# Patient Record
Sex: Male | Born: 1985 | Race: White | Hispanic: No | Marital: Single | Smoking: Former smoker
Health system: Southern US, Community
[De-identification: ages and names within clinical notes are randomized; demographics above are authoritative.]

## PROBLEM LIST (undated history)

## (undated) DIAGNOSIS — F419 Anxiety disorder, unspecified: Secondary | ICD-10-CM

## (undated) DIAGNOSIS — T7840XA Allergy, unspecified, initial encounter: Secondary | ICD-10-CM

## (undated) HISTORY — DX: Allergy, unspecified, initial encounter: T78.40XA

## (undated) HISTORY — DX: Anxiety disorder, unspecified: F41.9

---
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
## 2004-03-01 HISTORY — PX: FRACTURE SURGERY: SHX138

## 2004-06-20 IMAGING — CT CT HEAD W/O CM
1 series · 16 of 30 positions shown, 20 images · non-contrast
Comparison: none

CLINICAL DATA: Traumatic intraparenchymal and extraaxial hemorrhage.  Follow-up.
 CT HEAD WITHOUT CONTRAST
TECHNIQUE: Multiple contiguous axial images from the skull base to the vertex performed

[Series 2: trauma head · axial · 0.47mm/px · z∈[+166,+291]mm · 16 of 52 slices shown, 20 images]
[im 2/52  brain]
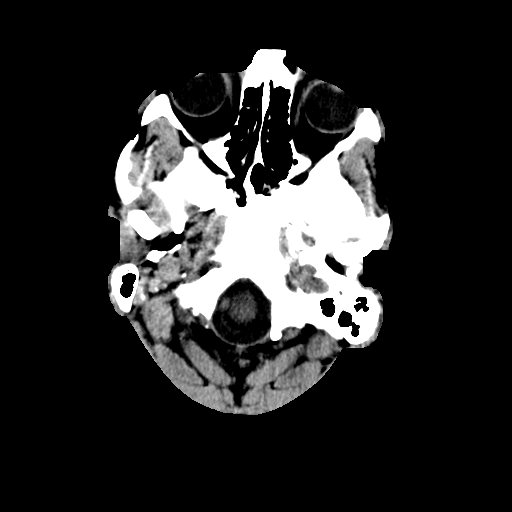
[im 2/52  bone]
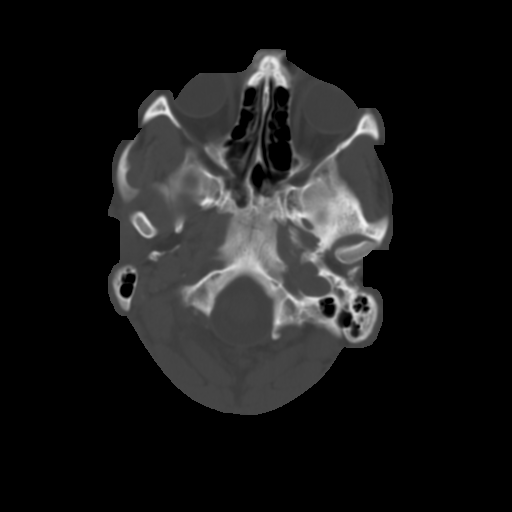
[im 6/52  brain]
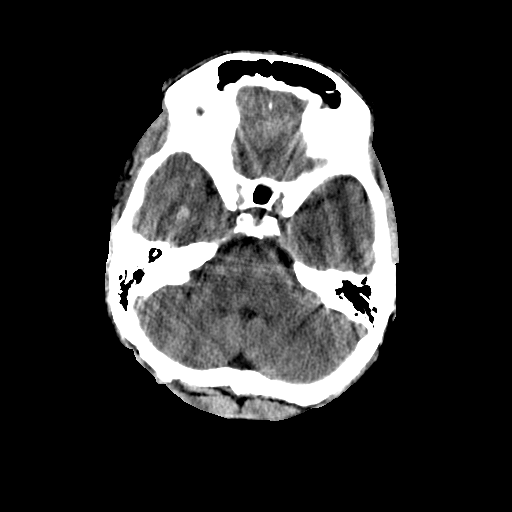
[im 9/52  brain]
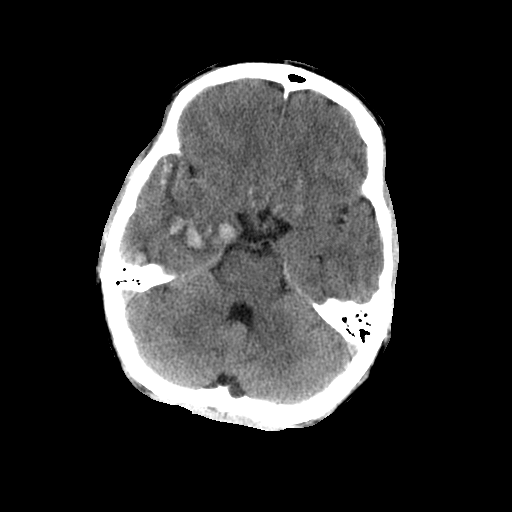
[im 13/52  brain]
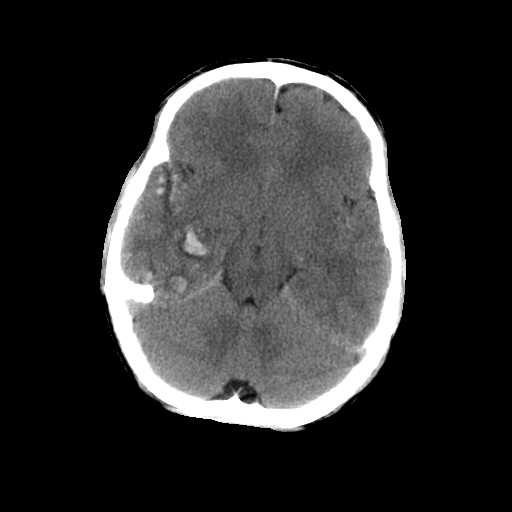
[im 15/52  brain]
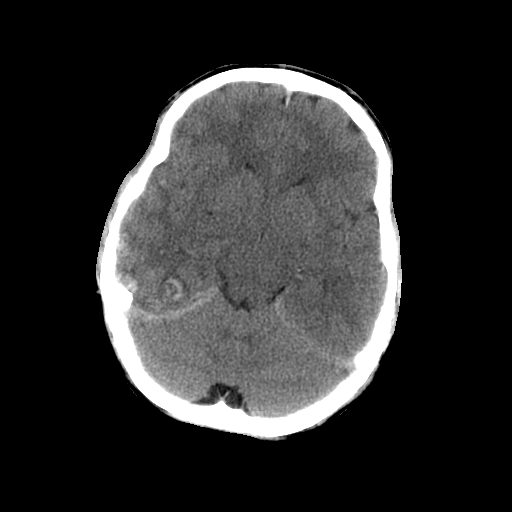
[im 15/52  bone]
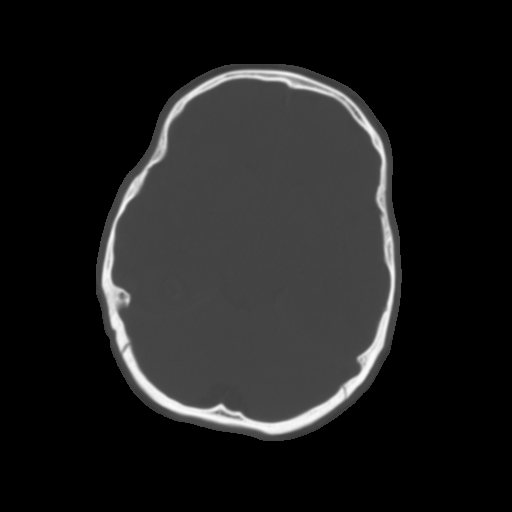
[im 18/52  brain]
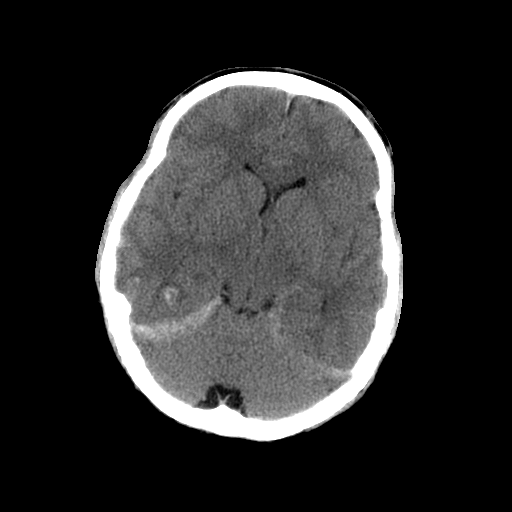
[im 22/52  brain]
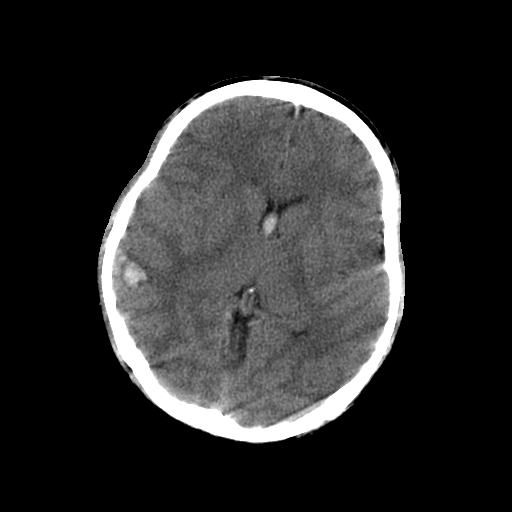
[im 25/52  brain]
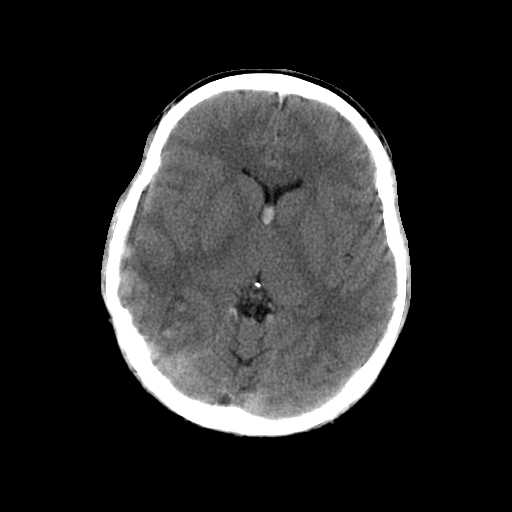
[im 27/52  brain]
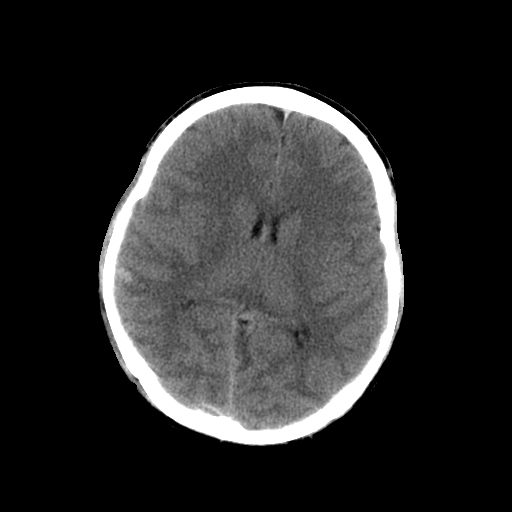
[im 27/52  bone]
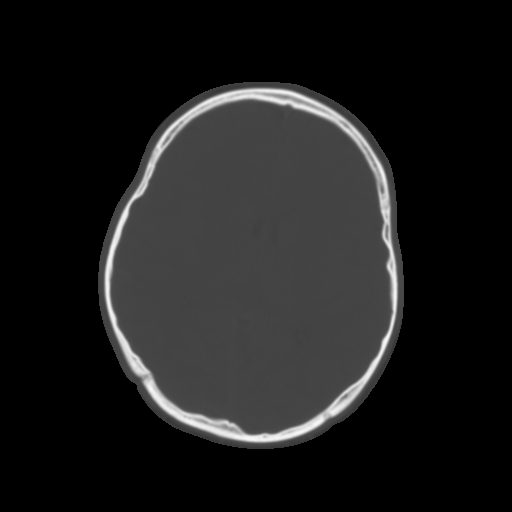
[im 30/52  brain]
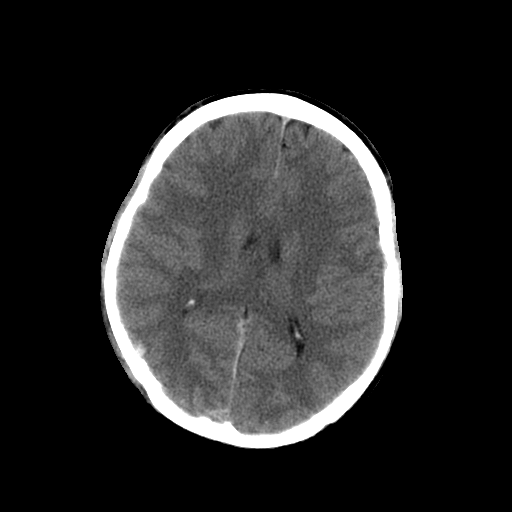
[im 34/52  brain]
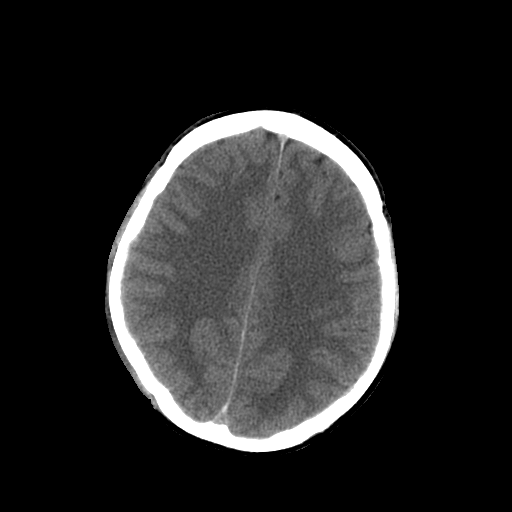
[im 37/52  brain]
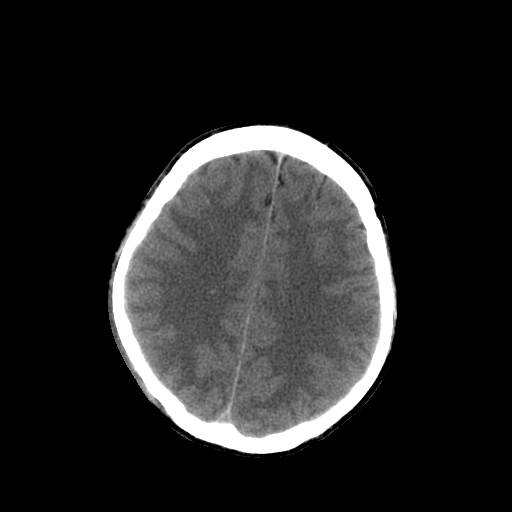
[im 39/52  brain]
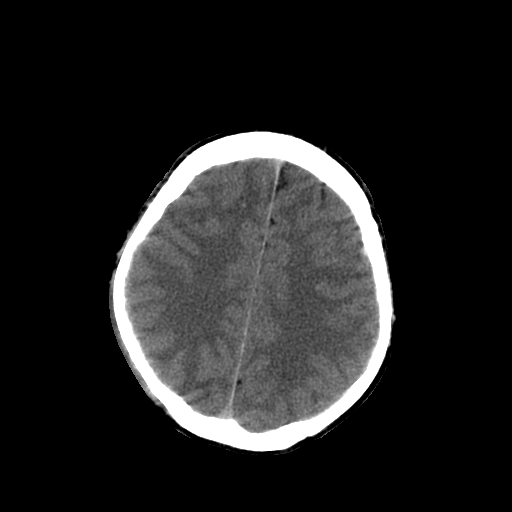
[im 39/52  bone]
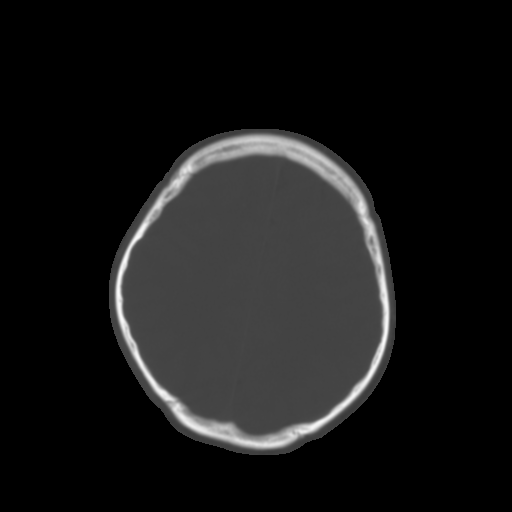
[im 43/52  brain]
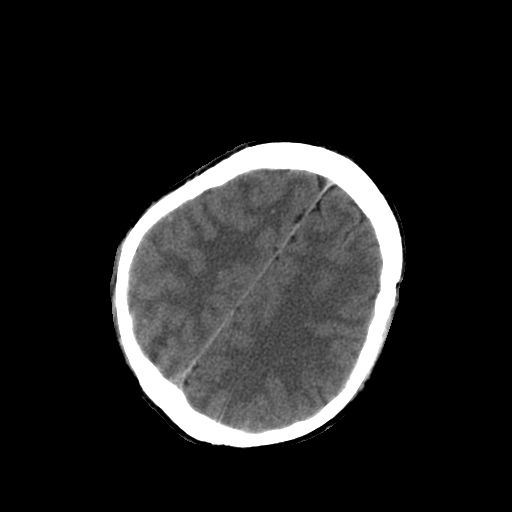
[im 46/52  brain]
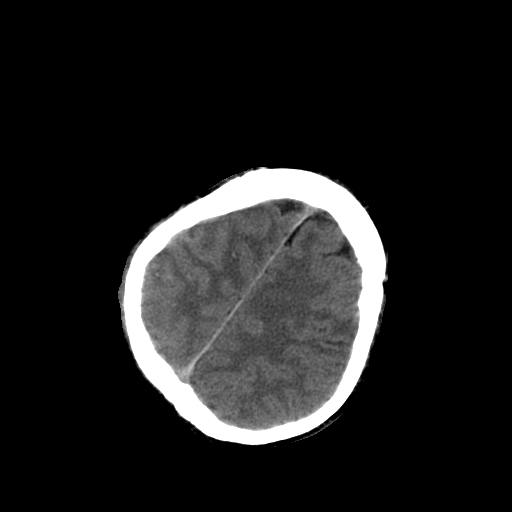
[im 50/52  brain]
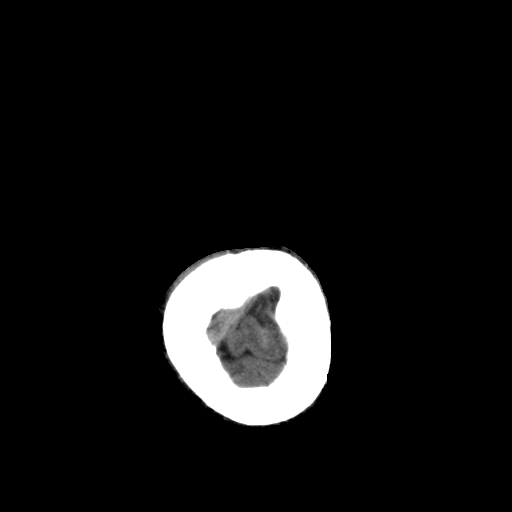

[16 of 30 positions shown; findings below may reference images not displayed]

FINDINGS: Comparison to 01/19/03.
 Again noted are multiple foci of intraparenchymal hemorrhage within bifrontal, right temporal, and bilateral convexity areas, greatest in the right temporal region.  These foci are essentially unchanged except for in the right temporal lobe, which may be slightly increased in size since the last study.  Blood layering on the right tentorium and blood along the anterior cavum septum pellucidum is unchanged.  No other changes identified.
 IMPRESSION 
 Multiple scattered areas of intracranial hemorrhage as described, essentially unchanged except for minimal increase in the right temporal lobe.

## 2012-04-07 ENCOUNTER — Ambulatory Visit (INDEPENDENT_AMBULATORY_CARE_PROVIDER_SITE_OTHER): Payer: BC Managed Care – PPO | Admitting: Physician Assistant

## 2012-04-07 VITALS — BP 135/84 | HR 70 | Temp 98.8°F | Resp 17 | Ht 72.0 in | Wt 196.0 lb

## 2012-04-07 DIAGNOSIS — F419 Anxiety disorder, unspecified: Secondary | ICD-10-CM | POA: Insufficient documentation

## 2012-04-07 DIAGNOSIS — Z23 Encounter for immunization: Secondary | ICD-10-CM

## 2012-04-07 DIAGNOSIS — F988 Other specified behavioral and emotional disorders with onset usually occurring in childhood and adolescence: Secondary | ICD-10-CM | POA: Insufficient documentation

## 2012-04-07 NOTE — Patient Instructions (Addendum)
Immunization Schedule, Adult  Influenza vaccine.  Adults should be given 1 dose every year.  Tetanus, diphtheria, and pertussis (Td, Tdap) vaccine.  Adults who have not previously been given Tdap or who do not know their vaccine status should be given 1 dose of Tdap.  Adults should have a Td booster every 10 years.  Doses should be given if needed to catch up on missed doses in the past.  Pregnant women should be given 1 dose of Tdap vaccine during each pregnancy.  Varicella vaccine.  All adults without evidence of immunity to varicella should receive 2 doses or a second dose if they have received only 1 dose.  Pregnant women who do not have evidence of immunity should be given the first dose after their pregnancy.  Human papillomavirus (HPV) vaccine.  Women aged 13 through 26 years who have not been given the vaccine previously should be given the 3 dose series. The second dose should be given 1 to 2 months after the first dose. The third dose should be given at least 24 weeks after the first dose.  The vaccine is not recommended for use in pregnant women. However, pregnancy testing is not needed before being given a dose. If a woman is found to be pregnant after being given a dose, no treatment is needed. In that case, the remaining doses should be delayed until after the pregnancy.  Men aged 13 through 21 years who have not been given the vaccine previously should be given the 3 dose series. Men aged 22 through 26 years may be given the 3 dose series. The second dose should be given 1 to 2 months after the first dose. The third dose should be given at least 24 weeks after the first dose.  Zoster vaccine.  One dose is recommended for adults aged 60 years and older unless certain conditions are present.  Measles, mumps, and rubella (MMR) vaccine.  Adults born before 1957 generally are considered immune to measles and mumps. Healthcare workers born before 1957 who do not have  evidence of immunity should consider vaccination.  Adults born in 1957 or later should have 1 or more doses of MMR vaccine unless there is a contraindication for the vaccine or they have evidence of immunity to the diseases. A second dose should be given at least 28 days after the first dose. Adults receiving certain types of previous vaccines should consider or be given vaccine doses.  For women of childbearing age, rubella immunity should be determined. If there is no evidence of immunity, women who are not pregnant should be vaccinated. If there is no evidence of immunity, women who are pregnant should delay vaccination until after their pregnancy.  Pneumococcal polysaccharide (PPSV23) vaccine.  All adults aged 65 years and older should be given 1 dose.  Adults younger than age 65 years who have certain medical conditions, who smoke cigarettes, who reside in nursing homes or long-term care facilities, or who have an unknown vaccination history should usually be given 1 or 2 doses of the vaccine.  Pneumococcal 13-valent conjugate (PVC13) vaccine.  Adults aged 19 years or older with certain medical conditions and an unknown or incomplete pneumococcal vaccination history should usually be given 1 dose of the vaccine. This dose may be in addition to a PPSV23 vaccine dose.  Meningococcal vaccine.  First-year college students up to age 21 years who are living in residence halls should be given a dose if they did not receive a dose on   or after their 16th birthday.  A dose should be given to microbiologists working with certain meningitis bacteria, military recruits, and people who travel to or live in countries with a high rate of meningitis.  One or 2 doses should be given to adults who have certain high-risk conditions.  Hepatitis A vaccine.  Adults who wish to be protected from this disease, who have certain high-risk conditions, who work with hepatitis A-infected animals, who work in  hepatitis A research labs, or who travel to or work in countries with a high rate of hepatitis A should be given the 2 dose series of the vaccine.  Adults who were previously unvaccinated and who anticipate close contact with an international adoptee during the first 60 days after arrival in the [place] from a country with a high rate of hepatitis A should be given the vaccine. The first dose of the 2 dose series should be given 2 or more weeks before the arrival of the adoptee.  Hepatitis B vaccine.  Adults who wish to be protected from this disease, who have certain high-risk conditions, who may be exposed to blood or other infectious body fluids, who are household contacts or sex partners of hepatitis B positive people, who are clients or workers in certain care facilities, or who travel to or work in countries with a high rate of hepatitis B should be given the 3 dose series of the vaccine. If you travel outside the [place], additional vaccines may be needed. The Centers for Disease Control and Prevention (CDC) provides information about the vaccines, medicines, and other measures necessary to prevent illness and injury during international travel. Visit the CDC website at www.cdc.gov/travel or call (800) CDC-INFO [800-232-4636]. You may also consult a travel clinic or your caregiver. Document Released: 05/08/2003 Document Revised: 05/10/2011 Document Reviewed: 04/02/2011 Exit[employer] Patient Information 2013 [employer].  

## 2012-04-07 NOTE — Progress Notes (Signed)
  Subjective:    Patient ID: Justin Mcguire, male    DOB: 1986/01/21, 27 y.o.   MRN: [id number]  HPI   Mr. Dock is a 27 yr old male here for immunization review.  He is a Consulting civil engineer at [employer].  Required imm include: DTP, Polio, MMR, Hep B, and Tdap  Pt brings state imm record.  Only one MMR documented, otherwise utd.  States last tdap was in 2009 done by [facility].    Review of Systems  All other systems reviewed and are negative.       Objective:   Physical Exam  Vitals reviewed. Constitutional: He is oriented to person, place, and time. He appears well-developed and well-nourished. No distress.  HENT:  Head: Normocephalic and atraumatic.  Cardiovascular: Normal rate, regular rhythm and normal heart sounds.  Exam reveals no gallop and no friction rub.   No murmur heard. Pulmonary/Chest: Effort normal. He has no wheezes. He has no rales.  Neurological: He is alert and oriented to person, place, and time.  Skin: Skin is warm and dry.  Psychiatric: He has a normal mood and affect. His behavior is normal.     Filed Vitals:   04/07/12 1342  BP: 135/84  Pulse: 70  Temp: 98.8 F (37.1 C)  Resp: 17        Assessment & Plan:   1. Need for measles-mumps-rubella (MMR) vaccine  Measles/Mumps/Rubella Immunity  2. Need for varicella vaccine  Varicella zoster antibody, IgG    Mr. Coey is a 27 yr old male here for immunization review.  Only one MMR documented, school requires documentation of two.  Will do MMR titers.  Will also do varicella titer as pt thinks he had disease but does not recall timing.  Will hold onto his forms until titers are back.  Will vaccinate if necessary based on results.

## 2012-04-10 ENCOUNTER — Telehealth: Payer: Self-pay | Admitting: Radiology

## 2012-04-10 LAB — MEASLES/MUMPS/RUBELLA IMMUNITY
Mumps IgG: <5 AU/mL (ref <9.00)
Rubeola IgG: 139 AU/mL — ABNORMAL HIGH (ref <25.00)

## 2012-04-10 LAB — VARICELLA ZOSTER ANTIBODY, IGG: Varicella IgG: 2593 Index — ABNORMAL HIGH (ref <135.00)

## 2012-04-10 NOTE — Telephone Encounter (Signed)
Patient questions when he can get records for immunizations. His titers still pending. Advised him we will call when they are available.

## 2012-04-10 NOTE — Addendum Note (Signed)
Addended by: Godfrey Pick on: 04/10/2012 02:20 PM   Modules accepted: Orders

## 2012-04-11 ENCOUNTER — Ambulatory Visit: Payer: BC Managed Care – PPO | Admitting: Physician Assistant

## 2012-04-11 DIAGNOSIS — Z23 Encounter for immunization: Secondary | ICD-10-CM

## 2012-04-11 NOTE — Progress Notes (Signed)
Patient here for MMR vaccine.  Titer showed he was not immune to mumps so he is here today for another booster.  Patient not seen by a provider.

## 2012-12-20 ENCOUNTER — Ambulatory Visit (INDEPENDENT_AMBULATORY_CARE_PROVIDER_SITE_OTHER): Payer: BC Managed Care – PPO | Admitting: Family Medicine

## 2012-12-20 ENCOUNTER — Ambulatory Visit: Payer: BC Managed Care – PPO

## 2012-12-20 VITALS — BP 132/82 | HR 79 | Temp 98.6°F | Resp 18

## 2012-12-20 DIAGNOSIS — S99912A Unspecified injury of left ankle, initial encounter: Secondary | ICD-10-CM

## 2012-12-20 DIAGNOSIS — M25569 Pain in unspecified knee: Secondary | ICD-10-CM

## 2012-12-20 DIAGNOSIS — S8990XA Unspecified injury of unspecified lower leg, initial encounter: Secondary | ICD-10-CM

## 2012-12-20 MED ORDER — HYDROCODONE-ACETAMINOPHEN 5-325 MG PO TABS: 1.0000 | ORAL_TABLET | Freq: Three times a day (TID) | ORAL | Status: DC | PRN

## 2012-12-20 NOTE — Patient Instructions (Signed)
Elevate and ice your ankle.  Use the boot for walking- for the time being use your crutches to take the weight off of your ankle.  As you get better you can progress to walking with the boot and without crutches.  Use the pain medication as needed.  You can also use aleve or ibuprofen.    If you are not making good progress in the next week or so please let me know- Sooner if worse.   If you are not getting better we will refer you to ankle PT

## 2012-12-20 NOTE — Progress Notes (Signed)
[facility] [address], [place] [address] [phone]  Date:  12/20/2012   Name:  Justin Mcguire   DOB:  1985-04-02   MRN:  [id number]  PCP:  No primary provider on file.    Chief Complaint: Ankle Pain   History of Present Illness:  Elad Macphail is a 27 y.o. very pleasant male patient who presents with the following:  He injured his left ankle yesterday.  He stepped off a porch and "came down wrong" on the ankle.  The ankle hurts and is swollen.  He has applied an ace wrap.  He is able to bear weight a little bit, but the ankle hurts a lot and he can't really walk.  Sitting in a wc today.  Here with his GF.   Otherwise he is unhurt.    He had an operative repair of his ankle in 2006.  This was done in [facility]- he is not quite sure where.   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 ADD (attention deficit disorder) 04/07/2012  . Anxiety 04/07/2012    Past Medical History  Diagnosis Date  . Allergy   . Anxiety     History reviewed. No pertinent past surgical history.  History  Substance Use Topics  . Smoking status: Former Smoker    Start date: 09/05/2011  . Smokeless tobacco: Not on file  . Alcohol Use: No    History reviewed. No pertinent family history.  Allergies  Allergen Reactions  . Bee Venom     Medication list has been reviewed and updated.  No current outpatient prescriptions on file prior to visit.   No current facility-administered medications on file prior to visit.    Review of Systems:  As per HPI- otherwise negative.   Physical Examination: Filed Vitals:   12/20/12 1253  BP: 132/82  Pulse: 79  Temp: 98.6 F (37 C)  Resp: 18   There were no vitals filed for this visit. There is no weight on file to calculate BMI. Ideal Body Weight:    GEN: WDWN, NAD, Non-toxic, A & O x 3, sitting in wheelchair HEENT:  Atraumatic, Normocephalic. Neck supple. No masses, No LAD. Ears and Nose: No external deformity. CV: RRR, No M/G/R. No JVD. No thrill. No extra heart sounds. PULM: CTA B, no wheezes, crackles, rhonchi. No retractions. No resp. distress. No accessory muscle use. EXTR: No c/c/e NEURO cannot bear weight on left ankle PSYCH: Normally interactive. Conversant. Not depressed or anxious appearing.  Calm demeanor.  Left ankle: tender and swollen over the lateral malleolus.  No obvious bruise.  Foot is negative, normal ROM and sensation of foot.    [facility] reading (PRIMARY) by  Dr. Patsy Lager. Left ankle: hardware in place, no fracture.    LEFT ANKLE COMPLETE - 3+ VIEW  COMPARISON: None.  FINDINGS: There is no evidence of fracture, dislocation, or joint effusion. There is no evidence of arthropathy or other focal bone abnormality. Soft tissues are unremarkable. Surgical screw in the medial malleolus. Normal bony alignment.  IMPRESSION: Surgical screw medial malleolus. Good bony alignment. No acute bony abnormality.  Assessment and Plan: Ankle injury, left, initial encounter - Plan: DG Ankle Complete Left, HYDROcodone-acetaminophen (NORCO/VICODIN) 5-325 MG per tablet  Ankle sprain.  Placed in a CAM boot and given crutches,  Ice, elevate and use hydrocodone  as needed.  If not better soon will plan to refer to PT for ankle therapy   Signed Abbe Amsterdam, MD

## 2013-03-16 ENCOUNTER — Ambulatory Visit (INDEPENDENT_AMBULATORY_CARE_PROVIDER_SITE_OTHER): Payer: No Typology Code available for payment source | Admitting: Emergency Medicine

## 2013-03-16 VITALS — BP 110/76 | HR 72 | Temp 97.5°F | Ht 71.5 in | Wt 206.6 lb

## 2013-03-16 DIAGNOSIS — J018 Other acute sinusitis: Secondary | ICD-10-CM

## 2013-03-16 MED ORDER — AMOXICILLIN-POT CLAVULANATE 875-125 MG PO TABS: 1.0000 | ORAL_TABLET | Freq: Two times a day (BID) | ORAL | Status: DC

## 2013-03-16 MED ORDER — PSEUDOEPHEDRINE-GUAIFENESIN ER 60-600 MG PO TB12: 1.0000 | ORAL_TABLET | Freq: Two times a day (BID) | ORAL | Status: DC

## 2013-03-16 NOTE — Patient Instructions (Signed)

## 2013-03-16 NOTE — Progress Notes (Signed)
[facility] [address], [place] [id number] [phone]  Date:  03/16/2013   Name:  Justin Mcguire   DOB:  1985-10-12   MRN:  [id number]  PCP:  No primary provider on file.    Chief Complaint: Nasal Congestion, Sinusitis and Headache   History of Present Illness:  Justin Mcguire is a 28 y.o. very pleasant male patient who presents with the following:  Ill three weeks with persistent nasal congestion and nasal drainage purulent in character.  Has moderate post nasal drip.  No fever or chills.  No nausea or vomiting.  No stool change.  Some cough that is not productive with no wheezing or shortness of breath.  Has persistent frontal headache.  No improvement with over the counter medications or other home remedies. Denies other complaint or health concern today.    Patient Active Problem List   Diagnosis Date Noted  . ADD (attention deficit disorder) 04/07/2012  . Anxiety 04/07/2012    Past Medical History  Diagnosis Date  . Allergy   . Anxiety     Past Surgical History  Procedure Laterality Date  . Fracture surgery      History  Substance Use Topics  . Smoking status: Former Smoker    Start date: 09/05/2011  . Smokeless tobacco: Not on file  . Alcohol Use: No    No family history on file.  Allergies  Allergen Reactions  . Bee Venom     Medication list has been reviewed and updated.  No current outpatient prescriptions on file prior to visit.   No current facility-administered medications on file prior to visit.    Review of Systems:  As per HPI, otherwise negative.    Physical Examination: Filed Vitals:   03/16/13 1131  BP: 110/76  Pulse: 72  Temp: 97.5 F (36.4 C)   Filed Vitals:   03/16/13 1131  Height: 5' 11.5" (1.816 m)  Weight: 206 lb 9.6 oz (93.713 kg)   Body mass index is 28.42 kg/(m^2). Ideal Body Weight: Weight in (lb) to have BMI = 25: 181.4  GEN: WDWN, NAD, Non-toxic, A & O x 3 HEENT: Atraumatic,  Normocephalic. Neck supple. No masses, No LAD. Ears and Nose: No external deformity. CV: RRR, No M/G/R. No JVD. No thrill. No extra heart sounds. PULM: CTA B, no wheezes, crackles, rhonchi. No retractions. No resp. distress. No accessory muscle use. ABD: S, NT, ND, +BS. No rebound. No HSM. EXTR: No c/c/e NEURO Normal gait.  PSYCH: Normally interactive. Conversant. Not depressed or anxious appearing.  Calm demeanor.    Assessment and Plan: Sinusitis Augmenting mucinex d  Signed,  Phillips OdorJeffery Anderson, MD

## 2013-10-09 ENCOUNTER — Ambulatory Visit (INDEPENDENT_AMBULATORY_CARE_PROVIDER_SITE_OTHER): Payer: No Typology Code available for payment source | Admitting: Physician Assistant

## 2013-10-09 VITALS — BP 178/94 | HR 86 | Temp 98.1°F | Resp 16 | Ht 71.75 in | Wt 215.4 lb

## 2013-10-09 DIAGNOSIS — F419 Anxiety disorder, unspecified: Secondary | ICD-10-CM

## 2013-10-09 DIAGNOSIS — R030 Elevated blood-pressure reading, without diagnosis of hypertension: Secondary | ICD-10-CM

## 2013-10-09 DIAGNOSIS — Z113 Encounter for screening for infections with a predominantly sexual mode of transmission: Secondary | ICD-10-CM

## 2013-10-09 DIAGNOSIS — F411 Generalized anxiety disorder: Secondary | ICD-10-CM

## 2013-10-09 DIAGNOSIS — IMO0001 Reserved for inherently not codable concepts without codable children: Secondary | ICD-10-CM

## 2013-10-09 LAB — POCT URINALYSIS DIPSTICK
Bilirubin, UA: NEGATIVE
Blood, UA: NEGATIVE
Glucose, UA: NEGATIVE
LEUKOCYTES UA: NEGATIVE
Nitrite, UA: NEGATIVE
Protein, UA: NEGATIVE
Spec Grav, UA: >=1.03
UROBILINOGEN UA: 0.2
pH, UA: 5.5

## 2013-10-09 LAB — POCT UA - MICROSCOPIC ONLY
Bacteria, U Microscopic: NEGATIVE
Casts, Ur, LPF, POC: NEGATIVE
Crystals, Ur, HPF, POC: NEGATIVE
EPITHELIAL CELLS, URINE PER MICROSCOPY: NEGATIVE
Mucus, UA: NEGATIVE
RBC, URINE, MICROSCOPIC: NEGATIVE
YEAST UA: NEGATIVE

## 2013-10-09 MED ORDER — ALPRAZOLAM 0.25 MG PO TABS
0.2500 mg | ORAL_TABLET | Freq: Once | ORAL | Status: AC
  Administered 2013-10-09: 0.25 mg via ORAL

## 2013-10-09 NOTE — Progress Notes (Signed)
Subjective:    Patient ID: Justin Mcguire, male    DOB: Oct 28, 1985, 28 y.o.   MRN: [id number]   PCP: No PCP Per Patient  Chief Complaint  Patient presents with  . STD Check    Medications, allergies, past medical history, surgical history, family history, social history and problem list reviewed and updated.  HPI  Has long had a "bump" on the penile shaft.  About a week ago, it became red and larger.  No pain.  At that time, he also developed a sore throat and tender nodes in the neck. The bump got smaller again, though it hasn't completely resolved.  The sore throat is some better, but persists. He's become very anxious about the possibility of having an STI.  He has a history of anxiety, and previously took alprazolam PRN.  He tried Vistaril, too, but didn't like it.  No penile discharge.  No urinary symptoms.  No fever, chills.  No GI symptoms.  No myalgias/arthralgias. No swollen or tender lymph nodes other than in his neck.  No other skin changes.  He is currently sexually active with 1 male partner.  Reports 1 partner in the past 30 days, 4 in the past year and 40 in his lifetime. He has never had STI screening.  Review of Systems As above.    Objective:   Physical Exam  Constitutional: He is oriented to person, place, and time. He appears well-developed and well-nourished. He is active and cooperative. No distress.  BP 178/94  Pulse 86  Temp(Src) 98.1 F (36.7 C) (Oral)  Resp 16  Ht 5' 11.75" (1.822 m)  Wt 215 lb 6.4 oz (97.705 kg)  BMI 29.43 kg/m2  SpO2 98%   HENT:  Head: Normocephalic and atraumatic.  Right Ear: External ear normal.  Left Ear: External ear normal.  Nose: Nose normal.  Mouth/Throat: Oropharynx is clear and moist. No oropharyngeal exudate.  Eyes: Conjunctivae and EOM are normal. Pupils are equal, round, and reactive to light. Right eye exhibits no discharge. Left eye exhibits no discharge. No scleral icterus.  Neck: Normal range of motion. Neck  supple. No thyromegaly present.  Cardiovascular: Normal rate, normal heart sounds and intact distal pulses.   Pulmonary/Chest: Effort normal and breath sounds normal.  Abdominal: Soft. Bowel sounds are normal. He exhibits no distension and no mass. There is no tenderness. Hernia confirmed negative in the right inguinal area and confirmed negative in the left inguinal area.  Genitourinary: Testes normal and penis normal.    Circumcised. No penile tenderness.  Lymphadenopathy:    He has no cervical adenopathy.       Right: No inguinal adenopathy present.       Left: No inguinal adenopathy present.  Neurological: He is alert and oriented to person, place, and time.  Skin: Skin is warm and dry.  Psychiatric: His speech is normal and behavior is normal. Thought content normal. His mood appears anxious. His affect is not inappropriate. He does not exhibit a depressed mood.  Initially, the patient became very anxious and tachypneic.  He practiced some deep breathing techniques, and received a 0.25 mg dose of alprazolam by mouth.  Once the examination was complete, he was much more relaxed.      Results for orders placed in visit on 10/09/13  POCT UA - MICROSCOPIC ONLY      Result Value Ref Range   WBC, Ur, HPF, POC 0-3     RBC, urine, microscopic neg  Bacteria, U Microscopic neg     Mucus, UA neg     Epithelial cells, urine per micros neg     Crystals, Ur, HPF, POC neg     Casts, Ur, LPF, POC neg     Yeast, UA neg    POCT URINALYSIS DIPSTICK      Result Value Ref Range   Color, UA yellow     Clarity, UA clear     Glucose, UA neg     Bilirubin, UA neg     Ketones, UA trace     Spec Grav, UA >=1.030     Blood, UA neg     pH, UA 5.5     Protein, UA neg     Urobilinogen, UA 0.2     Nitrite, UA neg     Leukocytes, UA Negative         Assessment & Plan:  1. Routine screening for STI (sexually transmitted infection) Counseled on consistent and correct condom use.  Lesion of  concern is likely a sebaceous cyst that was recently inflamed. - GC/Chlamydia Probe Amp - Hepatitis B surface antibody - Hepatitis B surface antigen - Hepatitis C antibody - HIV antibody - HSV(herpes simplex vrs) 1+2 ab-IgG - RPR - POCT UA - Microscopic Only - POCT urinalysis dipstick   2. Elevated BP Likely due to his heightened anxiety. Intended to recheck, but the patient left before it could be performed.  3. Anxiety 1 dose given in the office to reduce his acute anxiety. - ALPRAZolam (XANAX) tablet 0.25 mg; Take 1 tablet (0.25 mg total) by mouth once.   Fernande Brashelle S. Laberta Wilbon, PA-C Physician Assistant-Certified [facility]

## 2013-10-09 NOTE — Patient Instructions (Addendum)
I will contact you with your lab results as soon as they are available.   If you have not heard from me in 2 weeks, please contact me.  The fastest way to get your results is to register for My Chart (see the instructions on the last page of this printout).  At this time, there is NO evidence of a sexually transmitted infection. It appears that the bump is/was a sebaceous cyst, which is a benign lesion.  They can become larger and red, and can become infected with bacteria, but usually are asymptomatic.  As STIs can be asymptomatic in both men and women, we recommend the consistent and correct use of condoms with each sexual encounter unless you are in a long-term monogamous relationship or you and your partner are trying to become pregnant.

## 2013-10-10 LAB — HEPATITIS C ANTIBODY: HCV Ab: NEGATIVE

## 2013-10-10 LAB — GC/CHLAMYDIA PROBE AMP
CT PROBE, AMP APTIMA: NEGATIVE
GC PROBE AMP APTIMA: NEGATIVE

## 2013-10-10 LAB — HEPATITIS B SURFACE ANTIGEN: HEP B S AG: NEGATIVE

## 2013-10-10 LAB — HSV(HERPES SIMPLEX VRS) I + II AB-IGG: HSV 1 GLYCOPROTEIN G AB, IGG: 0.33 IV

## 2013-10-10 LAB — HIV ANTIBODY (ROUTINE TESTING W REFLEX): HIV: NONREACTIVE

## 2013-10-10 LAB — HEPATITIS B SURFACE ANTIBODY, QUANTITATIVE: HEPATITIS B-POST: 0 m[IU]/mL

## 2013-10-10 LAB — RPR

## 2013-10-11 NOTE — Progress Notes (Signed)
Quick Note:  Please call the patient with these results.  All STI tests performed are NEGATIVE. He is NOT immune to hepatitis B, and I encourage him to obtain that series at his earliest convenience. Also, encourage consistent and correct condom use. ______

## 2014-05-03 ENCOUNTER — Ambulatory Visit (INDEPENDENT_AMBULATORY_CARE_PROVIDER_SITE_OTHER): Payer: No Typology Code available for payment source | Admitting: Physician Assistant

## 2014-05-03 VITALS — BP 162/92 | HR 92 | Temp 97.9°F | Resp 18 | Wt 213.0 lb

## 2014-05-03 DIAGNOSIS — F988 Other specified behavioral and emotional disorders with onset usually occurring in childhood and adolescence: Secondary | ICD-10-CM

## 2014-05-03 DIAGNOSIS — F909 Attention-deficit hyperactivity disorder, unspecified type: Secondary | ICD-10-CM

## 2014-05-03 DIAGNOSIS — F419 Anxiety disorder, unspecified: Secondary | ICD-10-CM

## 2014-05-03 DIAGNOSIS — L739 Follicular disorder, unspecified: Secondary | ICD-10-CM | POA: Diagnosis not present

## 2014-05-03 DIAGNOSIS — Z6829 Body mass index (BMI) 29.0-29.9, adult: Secondary | ICD-10-CM | POA: Insufficient documentation

## 2014-05-03 MED ORDER — CLONAZEPAM 0.5 MG PO TABS: 0.2500 mg | ORAL_TABLET | Freq: Two times a day (BID) | ORAL | Status: DC | PRN

## 2014-05-03 MED ORDER — AMPHETAMINE-DEXTROAMPHETAMINE 10 MG PO TABS: 10.0000 mg | ORAL_TABLET | Freq: Two times a day (BID) | ORAL | Status: DC

## 2014-05-03 NOTE — Progress Notes (Signed)
Patient ID: Justin Mcguire, male    DOB: 19-May-1985, 29 y.o.   MRN: [id number]  PCP: No PCP Per Patient  Subjective:   Chief Complaint  Patient presents with  . sore in genital area    HPI  Lesion, "bump," 1 week, in the pubic area. Hasn't gone away, "I'm freaking out." "Maybe some itching, kind of hurts, but I haven't been poking at it." No treatment so far.  He was here 09/2013 concerned that he had an STI. He had a lesion on the penile shaft that turned out to be an inflamed sebaceous cyst. STI screening was negative, and included HIV.  Since then, he's had 3 male sexual partners. Inconsistent condom use. No penile discharge or urinary symptoms. No fever or chills. No swollen or tender lymph nodes. No appetite loss, weight loss. VERY anxious.  It has been about 10 years since he was on regular treatment for anxiety or ADD. He's interested in treatment for both. Scores high on ASRS for ADHD. When he was here in 8/15, his BP was elevated, thought likely due to untreated anxiety and he was prescribed alprazolam, which made him too sleepy. Clonazepam seemed to work previously, and he stopped picking at his skin. Didn't like the XR stimulants due to feelings of depression.   Review of Systems Review of Systems As above. Denies CP, SOB, dizziness.    Patient Active Problem List   Diagnosis Date Noted  . BMI 29.0-29.9,adult 05/03/2014  . ADD (attention deficit disorder) 04/07/2012  . Anxiety 04/07/2012     Prior to Admission medications   Not on File     Allergies  Allergen Reactions  . Bee Venom        Objective:  Physical Exam  Physical Exam  Constitutional: He is oriented to person, place, and time. He appears well-developed and well-nourished. He is active and cooperative. No distress.  BP 162/92 mmHg  Pulse 92  Temp(Src) 97.9 F (36.6 C) (Oral)  Resp 18  Wt 213 lb (96.616 kg)  SpO2 99%   HENT:  Head: Normocephalic and atraumatic.  Eyes: Conjunctivae are  normal. No scleral icterus.  Pulmonary/Chest: Effort normal.  Genitourinary: Testes normal and penis normal.    Circumcised.  Genital hair has been shaved. Short hair growing in the mons pubis area, clean shaven elsewhere, including the scrotum. There is a small erythematous lump associated with a hair follicle on the right mons. Minimal tenderness. No surrounding induration and non-fluctuant.  Lymphadenopathy:       Right: No inguinal adenopathy present.       Left: No inguinal adenopathy present.  Neurological: He is alert and oriented to person, place, and time.  Psychiatric: His speech is normal and behavior is normal. His mood appears anxious (until reasurred that the lesion does NOT suggest an STI, he is obviously uncomfortable, flushed and repeatedly states, "I think I'm going to have a panic attack." Once he was assured , he was relaxed and calm.). His affect is not angry, not blunt, not labile and not inappropriate. He does not exhibit a depressed mood.           Assessment & Plan:   1. Folliculitis Mild. Warm compress/heating pad and OTC NSAIDS. RTC if worsens/persists.  2. ADD (attention deficit disorder) Restart Adderall at 10 mg BID, though he recalls he took 20 mg BID previously. RTC 4 weeks to assess improvement. - amphetamine-dextroamphetamine (ADDERALL) 10 MG tablet; Take 1 tablet (10 mg total) by mouth 2 (  two) times daily with a meal.  Dispense: 60 tablet; Refill: 0  3. Anxiety Suspect that untreated ADD is driving this, at least in part. Restart clonazepam PRN. Reassess at 4 week follow-up. - clonazePAM (KLONOPIN) 0.5 MG tablet; Take 0.5-1 tablets (0.25-0.5 mg total) by mouth 2 (two) times daily as needed for anxiety.  Dispense: 30 tablet; Refill: 0  Given his new sexual partners and inconsistent condom use, I encouraged him to have repeat STI screening, which he declined.  Fernande Bras, PA-C Physician Assistant-Certified [facility]

## 2014-05-03 NOTE — Patient Instructions (Signed)
Use a warm compress for 15-20 minutes 2-4 times a day to reduce the inflammation. OTC Ibuprofen can help, too.  [facility] Policy for Prescribing Controlled Substances (Revised 12/2011) 1. Prescriptions for controlled substances will be filled by ONE provider at [facility] with whom you have established and developed a plan for your care, including follow-up. 2. You are encouraged to schedule an appointment with your prescriber at our appointment center for follow-up visits whenever possible. 3. If you request a prescription for the controlled substance while at [facility] an [facility] (with someone other than your regular prescriber), you MAY be given a ONE-TIME prescription for a 30-day supply of the controlled substance, to allow time for you to return to see your regular prescriber for additional prescriptions.

## 2014-07-22 ENCOUNTER — Emergency Department (HOSPITAL_COMMUNITY): Payer: No Typology Code available for payment source

## 2014-07-22 ENCOUNTER — Emergency Department (HOSPITAL_COMMUNITY)
Admission: EM | Admit: 2014-07-22 | Discharge: 2014-07-23 | Disposition: A | Discharge status: Home / Self Care | Payer: No Typology Code available for payment source | Attending: Emergency Medicine | Admitting: Emergency Medicine

## 2014-07-22 ENCOUNTER — Encounter (HOSPITAL_COMMUNITY): Payer: Self-pay | Admitting: *Deleted

## 2014-07-22 DIAGNOSIS — Z720 Tobacco use: Secondary | ICD-10-CM | POA: Insufficient documentation

## 2014-07-22 DIAGNOSIS — R0789 Other chest pain: Secondary | ICD-10-CM | POA: Diagnosis not present

## 2014-07-22 DIAGNOSIS — F419 Anxiety disorder, unspecified: Secondary | ICD-10-CM

## 2014-07-22 DIAGNOSIS — K219 Gastro-esophageal reflux disease without esophagitis: Secondary | ICD-10-CM | POA: Diagnosis not present

## 2014-07-22 DIAGNOSIS — R079 Chest pain, unspecified: Secondary | ICD-10-CM | POA: Diagnosis present

## 2014-07-22 DIAGNOSIS — Z79899 Other long term (current) drug therapy: Secondary | ICD-10-CM | POA: Diagnosis not present

## 2014-07-22 LAB — BASIC METABOLIC PANEL
ANION GAP: 10 (ref 5–15)
BUN: 16 mg/dL (ref 6–20)
CALCIUM: 9.8 mg/dL (ref 8.9–10.3)
CHLORIDE: 108 mmol/L (ref 101–111)
CO2: 23 mmol/L (ref 22–32)
CREATININE: 1.01 mg/dL (ref 0.61–1.24)
GFR calc Af Amer: >60 mL/min (ref >60)
GFR calc non Af Amer: >60 mL/min (ref >60)
Glucose, Bld: 100 mg/dL — ABNORMAL HIGH (ref 65–99)
POTASSIUM: 4.3 mmol/L (ref 3.5–5.1)
Sodium: 141 mmol/L (ref 135–145)

## 2014-07-22 LAB — CBC
HCT: 50 % (ref 39.0–52.0)
HEMOGLOBIN: 17.1 g/dL — AB (ref 13.0–17.0)
MCH: 31.1 pg (ref 26.0–34.0)
MCHC: 34.2 g/dL (ref 30.0–36.0)
MCV: 91.1 fL (ref 78.0–100.0)
Platelets: 197 10*3/uL (ref 150–400)
RBC: 5.49 MIL/uL (ref 4.22–5.81)
RDW: 13.2 % (ref 11.5–15.5)
WBC: 6.5 10*3/uL (ref 4.0–10.5)

## 2014-07-22 LAB — I-STAT TROPONIN, ED: Troponin i, poc: 0 ng/mL (ref 0.00–0.08)

## 2014-07-22 MED ORDER — PANTOPRAZOLE SODIUM 40 MG PO TBEC
40.0000 mg | DELAYED_RELEASE_TABLET | Freq: Once | ORAL | Status: AC
  Administered 2014-07-22: 40 mg via ORAL
  Filled 2014-07-22: qty 1

## 2014-07-22 MED ORDER — PANTOPRAZOLE SODIUM 20 MG PO TBEC: 20.0000 mg | DELAYED_RELEASE_TABLET | Freq: Every day | ORAL | Status: DC

## 2014-07-22 MED ORDER — ACETAMINOPHEN 500 MG PO TABS
1000.0000 mg | ORAL_TABLET | Freq: Once | ORAL | Status: AC
  Administered 2014-07-22: 1000 mg via ORAL
  Filled 2014-07-22: qty 2

## 2014-07-22 MED ORDER — LORAZEPAM 1 MG PO TABS
1.0000 mg | ORAL_TABLET | Freq: Once | ORAL | Status: AC
  Administered 2014-07-22: 1 mg via ORAL
  Filled 2014-07-22: qty 1

## 2014-07-22 NOTE — Discharge Instructions (Signed)
Chest Pain (Nonspecific) °It is often hard to give a specific diagnosis for the cause of chest pain. There is always a chance that your pain could be related to something serious, such as a heart attack or a blood clot in the lungs. You need to follow up with your health care provider for further evaluation. °CAUSES  °· Heartburn. °· Pneumonia or bronchitis. °· Anxiety or stress. °· Inflammation around your heart (pericarditis) or lung (pleuritis or pleurisy). °· A blood clot in the lung. °· A collapsed lung (pneumothorax). It can develop suddenly on its own (spontaneous pneumothorax) or from trauma to the chest. °· Shingles infection (herpes zoster virus). °The chest wall is composed of bones, muscles, and cartilage. Any of these can be the source of the pain. °· The bones can be bruised by injury. °· The muscles or cartilage can be strained by coughing or overwork. °· The cartilage can be affected by inflammation and become sore (costochondritis). °DIAGNOSIS  °Lab tests or other studies may be needed to find the cause of your pain. Your health care provider may have you take a test called an ambulatory electrocardiogram (ECG). An ECG records your heartbeat patterns over a 24-hour period. You may also have other tests, such as: °· Transthoracic echocardiogram (TTE). During echocardiography, sound waves are used to evaluate how blood flows through your heart. °· Transesophageal echocardiogram (TEE). °· Cardiac monitoring. This allows your health care provider to monitor your heart rate and rhythm in real time. °· Holter monitor. This is a portable device that records your heartbeat and can help diagnose heart arrhythmias. It allows your health care provider to track your heart activity for several days, if needed. °· Stress tests by exercise or by giving medicine that makes the heart beat faster. °TREATMENT  °· Treatment depends on what may be causing your chest pain. Treatment may include: °· Acid blockers for  heartburn. °· Anti-inflammatory medicine. °· Pain medicine for inflammatory conditions. °· Antibiotics if an infection is present. °· You may be advised to change lifestyle habits. This includes stopping smoking and avoiding alcohol, caffeine, and chocolate. °· You may be advised to keep your head raised (elevated) when sleeping. This reduces the chance of acid going backward from your stomach into your esophagus. °Most of the time, nonspecific chest pain will improve within 2-3 days with rest and mild pain medicine.  °HOME CARE INSTRUCTIONS  °· If antibiotics were prescribed, take them as directed. Finish them even if you start to feel better. °· For the next few days, avoid physical activities that bring on chest pain. Continue physical activities as directed. °· Do not use any tobacco products, including cigarettes, chewing tobacco, or electronic cigarettes. °· Avoid drinking alcohol. °· Only take medicine as directed by your health care provider. °· Follow your health care provider's suggestions for further testing if your chest pain does not go away. °· Keep any follow-up appointments you made. If you do not go to an appointment, you could develop lasting (chronic) problems with pain. If there is any problem keeping an appointment, call to reschedule. °SEEK MEDICAL CARE IF:  °· Your chest pain does not go away, even after treatment. °· You have a rash with blisters on your chest. °· You have a fever. °SEEK IMMEDIATE MEDICAL CARE IF:  °· You have increased chest pain or pain that spreads to your arm, neck, jaw, back, or abdomen. °· You have shortness of breath. °· You have an increasing cough, or you cough   up blood. °· You have severe back or abdominal pain. °· You feel nauseous or vomit. °· You have severe weakness. °· You faint. °· You have chills. °This is an emergency. Do not wait to see if the pain will go away. Get medical help at once. Call your local emergency services (911 in [place] Do not drive  yourself to the hospital. °MAKE SURE YOU:  °· Understand these instructions. °· Will watch your condition. °· Will get help right away if you are not doing well or get worse. °Document Released: 11/25/2004 Document Revised: 02/20/2013 Document Reviewed: 09/21/2007 °ExitCare® Patient Information ©2015 [employer]. This information is not intended to replace advice given to you by your health care provider. Make sure you discuss any questions you have with your health care provider. °Gastroesophageal Reflux Disease, Adult °Gastroesophageal reflux disease (GERD) happens when acid from your stomach flows up into the esophagus. When acid comes in contact with the esophagus, the acid causes soreness (inflammation) in the esophagus. Over time, GERD may create small holes (ulcers) in the lining of the esophagus. °CAUSES  °· Increased body weight. This puts pressure on the stomach, making acid rise from the stomach into the esophagus. °· Smoking. This increases acid production in the stomach. °· Drinking alcohol. This causes decreased pressure in the lower esophageal sphincter (valve or ring of muscle between the esophagus and stomach), allowing acid from the stomach into the esophagus. °· Late evening meals and a full stomach. This increases pressure and acid production in the stomach. °· A malformed lower esophageal sphincter. °Sometimes, no cause is found. °SYMPTOMS  °· Burning pain in the lower part of the mid-chest behind the breastbone and in the mid-stomach area. This may occur twice a week or more often. °· Trouble swallowing. °· Sore throat. °· Dry cough. °· Asthma-like symptoms including chest tightness, shortness of breath, or wheezing. °DIAGNOSIS  °Your caregiver may be able to diagnose GERD based on your symptoms. In some cases, X-rays and other tests may be done to check for complications or to check the condition of your stomach and esophagus. °TREATMENT  °Your caregiver may recommend over-the-counter or  prescription medicines to help decrease acid production. Ask your caregiver before starting or adding any new medicines.  °HOME CARE INSTRUCTIONS  °· Change the factors that you can control. Ask your caregiver for guidance concerning weight loss, quitting smoking, and alcohol consumption. °· Avoid foods and drinks that make your symptoms worse, such as: °¨ Caffeine or alcoholic drinks. °¨ Chocolate. °¨ Peppermint or mint flavorings. °¨ Garlic and onions. °¨ Spicy foods. °¨ Citrus fruits, such as oranges, lemons, or limes. °¨ Tomato-based foods such as sauce, chili, salsa, and pizza. °¨ Fried and fatty foods. °· Avoid lying down for the 3 hours prior to your bedtime or prior to taking a nap. °· Eat small, frequent meals instead of large meals. °· Wear loose-fitting clothing. Do not wear anything tight around your waist that causes pressure on your stomach. °· Raise the head of your bed 6 to 8 inches with wood blocks to help you sleep. Extra pillows will not help. °· Only take over-the-counter or prescription medicines for pain, discomfort, or fever as directed by your caregiver. °· Do not take aspirin, ibuprofen, or other nonsteroidal anti-inflammatory drugs (NSAIDs). °SEEK IMMEDIATE MEDICAL CARE IF:  °· You have pain in your arms, neck, jaw, teeth, or back. °· Your pain increases or changes in intensity or duration. °· You develop nausea, vomiting, or sweating (diaphoresis). °·   You develop shortness of breath, or you faint. °· Your vomit is green, yellow, black, or looks like coffee grounds or blood. °· Your stool is red, bloody, or black. °These symptoms could be signs of other problems, such as heart disease, gastric bleeding, or esophageal bleeding. °MAKE SURE YOU:  °· Understand these instructions. °· Will watch your condition. °· Will get help right away if you are not doing well or get worse. °Document Released: 11/25/2004 Document Revised: 05/10/2011 Document Reviewed: 09/04/2010 °ExitCare® Patient  Information ©2015 [employer]. This information is not intended to replace advice given to you by your health care provider. Make sure you discuss any questions you have with your health care provider. °Food Choices for Gastroesophageal Reflux Disease °When you have gastroesophageal reflux disease (GERD), the foods you eat and your eating habits are very important. Choosing the right foods can help ease the discomfort of GERD. °WHAT GENERAL GUIDELINES DO I NEED TO FOLLOW? °· Choose fruits, vegetables, whole grains, low-fat dairy products, and low-fat meat, fish, and poultry. °· Limit fats such as oils, salad dressings, butter, nuts, and avocado. °· Keep a food diary to identify foods that cause symptoms. °· Avoid foods that cause reflux. These may be different for different people. °· Eat frequent small meals instead of three large meals each day. °· Eat your meals slowly, in a relaxed setting. °· Limit fried foods. °· Cook foods using methods other than frying. °· Avoid drinking alcohol. °· Avoid drinking large amounts of liquids with your meals. °· Avoid bending over or lying down until 2-3 hours after eating. °WHAT FOODS ARE NOT RECOMMENDED? °The following are some foods and drinks that may worsen your symptoms: °Vegetables °Tomatoes. Tomato juice. Tomato and spaghetti sauce. Chili peppers. Onion and garlic. Horseradish. °Fruits °Oranges, grapefruit, and lemon (fruit and juice). °Meats °High-fat meats, fish, and poultry. This includes hot dogs, ribs, ham, sausage, salami, and bacon. °Dairy °Whole milk and chocolate milk. Sour cream. Cream. Butter. Ice cream. Cream cheese.  °Beverages °Coffee and tea, with or without caffeine. Carbonated beverages or energy drinks. °Condiments °Hot sauce. Barbecue sauce.  °Sweets/Desserts °Chocolate and cocoa. Donuts. Peppermint and spearmint. °Fats and Oils °High-fat foods, including French fries and potato chips. °Other °Vinegar. Strong spices, such as black pepper, white  pepper, red pepper, cayenne, curry powder, cloves, ginger, and chili powder. °The items listed above may not be a complete list of foods and beverages to avoid. Contact your dietitian for more information. °Document Released: 02/15/2005 Document Revised: 02/20/2013 Document Reviewed: 12/20/2012 °ExitCare® Patient Information ©2015 [employer]. This information is not intended to replace advice given to you by your health care provider. Make sure you discuss any questions you have with your health care provider. ° °Emergency Department Resource Guide °1) Find a Doctor and Pay Out of Pocket °Although you won't have to find out who is covered by your insurance plan, it is a good idea to ask around and get recommendations. You will then need to call the office and see if the doctor you have chosen will accept you as a new patient and what types of options they offer for patients who are self-pay. Some doctors offer discounts or will set up payment plans for their patients who do not have insurance, but you will need to ask so you aren't surprised when you get to your appointment. ° °2) Contact Your Local Health Department °Not all health departments have doctors that can see patients for sick visits, but many do, so   it is worth a call to see if yours does. If you don't know where your local health department is, you can check in your phone book. The CDC also has a tool to help you locate your state's health department, and many state websites also have listings of all of their local health departments. ° °3) Find a Walk-in Clinic °If your illness is not likely to be very severe or complicated, you may want to try a walk in clinic. These are popping up all over the country in pharmacies, drugstores, and shopping centers. They're usually staffed by nurse practitioners or physician assistants that have been trained to treat common illnesses and complaints. They're usually fairly quick and inexpensive. However, if you  have serious medical issues or chronic medical problems, these are probably not your best option. ° °No Primary Care Doctor: °- Call [facility] at  832-8000 - they can help you locate a primary care doctor that  accepts your insurance, provides certain services, etc. °- Physician Referral Service- 1-800-533-3463 ° °Chronic Pain Problems: °Organization         Address  Phone   Notes  °[facility]  ([phone]) [phone] Patients need to be referred by their primary care doctor.  ° °Medication Assistance: °Organization         Address  Phone   Notes  °[place] [facility] [address] °[place], [place] [address] (336) 641-8030 --Must be a resident of [place] °-- Must have NO insurance coverage whatsoever (no Medicaid/ Medicare, etc.) °-- The pt. MUST have a primary care doctor that directs their care regularly and follows them in the community °  °[facility]  (866) 331-1348   °[employer]  (888) 892-1162   ° °Agencies that provide inexpensive medical care: °Organization         Address  Phone   Notes  °[facility]  (336) 832-8035   °[facility]    (336) 832-7272   °[facility] [address] °[place], [place] [address] (336) 832-4777   °[facility] [address], °[place] (336) 271-4999   °[facility]    (336) 373-0678   °[facility]    (336) 272-1050   °[facility] ° [address], [place] Phone:  (336) 832-4444, Fax:  (336) 832-4440 Hours of Operation:  9 am - 6 pm, M-F.  Also accepts Medicaid/Medicare and self-pay.  °[facility] ° [address], [place] Phone: (336) [phone], Fax: (336) [phone]. Hours of Operation:  8:30 am - 5:30 pm, M-F.  Also accepts Medicaid and self-pay.  °[facility] [place] [address], [place] Phone: (336) 878-6027   °[facility] [address], [place], [place] (336)723-1848, Ext. 123 Mondays & Thursdays: 7-9 AM.  First 15 patients are seen on a first come, first serve basis. °  ° °Medicaid-accepting [place] Providers: ° °Organization         Address  Phone   Notes  °[facility] [address], [place] (336) [phone] Also accepts self-pay patients.  °[facility] [address], [place] ° (336) [phone]   °[facility] [address], [place] (336) 288-8857   °[facility] [address] [place] [address], [place] (336) 299-7000   °Veita Bland [address], [place]  ° (336) [phone] Only accepts [place] Access Medicaid patients after they have their name applied to their card.  ° °Self-Pay (no insurance) in [place]: ° °Organization         Address  Phone   Notes  °Sickle Cell Patients, [facility] [address], [place] (336) 832-1970   °[facility] [address], [place] (336) 832-4400   °[facility] ° [address], [place] (336) 992-4800   °[facility]/Dr. Osei-Bonsu ° [address] [place] [address], [place] or [address], [place] (336) [phone] Phone number for both [place] and [place] locations is the same.  °[facility] [address], [place] (336) 299-0000   °[facility] [address] [place] [address], [place] or [address] (336) 852-7530 °(336) 878-2260   °[facility] [address], [place] (336) 350-1642, phone; (336) 294-5005, fax Sees patients 1st and 3rd Saturday of every month.  Must not qualify for public or private insurance (i.e. Medicaid, Medicare, [place] Health Choice, Veterans' Benefits) • Household income should be no more than 200% of the poverty level •The clinic cannot treat you if you are pregnant or think you are pregnant •  Sexually transmitted diseases are not treated at the clinic.  ° ° °Dental Care: °Organization         Address  Phone  Notes  °[facility] [address], [place] (336) 641-6152 Accepts children up to age 21 who are enrolled in Medicaid or [place] Health Choice; pregnant women with a Medicaid card; and children who have applied for Medicaid or [place] Health Choice, but were declined, whose parents can pay a reduced fee at time of service.  °[facility] [place]  [address], [place] (336) 641-7733 Accepts children up to age 21 who are enrolled in Medicaid or [place] Health Choice; pregnant women with a Medicaid card; and children who have applied for Medicaid or [place] Health Choice, but were declined, whose parents can pay a reduced fee at time of service.  °[facility] ° [address], [place] (336) 641-4533 Patients are seen by appointment only. Walk-ins are not accepted. [facility] will see patients 18 years of age and older. °Monday - Tuesday (8am-5pm) °Most Wednesdays (8:30-5pm) °$30 per visit, cash only  °[facility] ° [address], [place] (336) [phone] Patients are seen by appointment only. Walk-ins are not accepted. [facility] will see patients 18 years of age and older. °One Wednesday Evening (Monthly: Volunteer Based).  $30 per visit, cash only  °[facility]  (919) 537-3737 for adults; Children under age 4, call Graduate Pediatric Dentistry at ([phone]) [phone]. Children aged 4-14, please call ([phone]) [phone] to request a pediatric application. ° Dental services are provided in all areas of dental care including fillings, crowns and bridges, complete and partial dentures, implants, gum treatment, root canals, and extractions. Preventive care is also provided. Treatment is provided to both adults and children. °Patients  are selected via a lottery and there is often a waiting list. °  °[facility] [address], °[place] ° (336) 763-8833 [email or web address] °  °[facility] [address], [place],  (336)723-1848, Ext. 123 Second and Fourth   Thursday of each month, opens at 6:30 AM; Clinic ends at 9 AM.  Patients are seen on a first-come first-served basis, and a limited number are seen during each clinic.  ° °[facility] ° [address], [place], [place] (336) [phone]   Eligibility Requirements °You must have lived in [place], or [place] counties for at least the last three months. °  You cannot be eligible for state or federal sponsored healthcare insurance, including Veterans Administration, Medicaid, or Medicare. °  You generally cannot be eligible for healthcare insurance through your employer.  °  How to apply: °Eligibility screenings are held every Tuesday and Wednesday afternoon from 1:00 pm until 4:00 pm. You do not need an appointment for the interview!  °[facility] [address], [place], [place] 336-631-2330   °[facility]  336-342-8273   °[facility]  336-703-3100   °[facility]  336-570-6415   ° °Behavioral Health Resources in the Community: °Intensive Outpatient Programs °Organization         Address  Phone  Notes  °[facility] [address], [place], [place] 336-878-6098   °[facility] [address], [place], [place] 336-832-9800   °[facility] [address], [place], [place] ° 336-882-2125   °[facility] [address],  °[place], [place] 1-800-853-5163 or 336-641-4981   °Substance Abuse Resources °Organization         Address  Phone  Notes  °[facility]  336-882-2125   °[facility]  336-784-9470   °[facility]  336-285-9073   °[facility]  336-845-3988    °Residential & Outpatient Substance Abuse Program  1-800-659-3381   °Psychological Services °Organization         Address  Phone  Notes  ° Health  336- 832-9600   °[facility]  336- 378-7881   °[facility] [address], [place] 1-800-853-5163 or 336-641-4981   ° °Mobile Crisis Teams °Organization         Address  Phone  Notes  °[facility], Mobile Crisis Care Unit  1-877-626-1772   °Assertive °[facility] ° [address] [place], [place] 336-834-9664   °Sharon DeEsch [address] °[place] 336-554-5454   ° °Self-Help/Support Groups °Organization         Address  Phone             Notes  °[facility] - variety of support groups  336- 373-1402 Call for more information  °Narcotics Anonymous (NA), [facility] [address], °[place]  2 meetings at this location  ° °Residential Treatment Programs °Organization         Address  Phone  Notes  °[facility] [address],    °[place]  1-866-801-8205   °[facility] ° [address], [place], [place] 704-293-8524   °[facility] [address], [place] 336-845-3988 Admissions: 8am-3pm M-F  °[facility] [address]    °[place], [place] 336-841-1104   °[facility] [address], [place], [place] 336-379-7146   °[facility] [address]  °[place], [place] 336-285-9073   °[facility] Intensive Outpatient [address], [place], [place] 336-852-3033   °[facility] [address]  °  [place], [place] 1-877-615-2722 or 336-784-9470   °[facility] [address] [place], [place] 336-227-7417 Accepts Medicaid  °[facility] [address]  °[place] 1-800-659-3381 Substance Abuse/Addiction Treatment  ° °[place] [facility]  Resources °Organization         Address  Phone  Notes  °[facility]  (888) 581-9988   °Julie Brannon, PhD [address] [place], [place]   (336) 349-5553 or (336) 951-0000   °[facility]   [address] °[place], [place] (336) 349-4454   °[facility] [address], [place], [place] (336) 342-8316 Insurance/Medicaid/sponsorship through [facility]  °[facility] [address]                                    [place], [place] (336) 342-8316 Therapy/tele-psych/case  °[facility] [address]  ° [place], [place] (336) 349-2233    °Dr. Arfeen  (336) 349-4544   °[facility] [place]  [employer] [place] [facility] 1) [address], [place] °2) [address], [place] °3)  [address], [place] (336) 349-3220 °(336) 342-7768 ° °(336) 342-8140   °[place] [facility] (336) 342-1394 or (336) 342-3537 (After Hours)    ° ° ° ° °

## 2014-07-22 NOTE — ED Notes (Signed)
Pt states that he has discomfort to his central chest that is worse with movement and when he "thinks about it"; pt states that it has been recurrent for the last 30-45 days; pt states that he was reading about and since he indigestion as well he thought he needed to have it checked out; pt denies short of breath currently but states it does happen sometimes; pt appears very anxious in triage

## 2014-07-22 NOTE — ED Notes (Signed)
Patient is extremely anxious, reports history of same.

## 2014-07-22 NOTE — ED Provider Notes (Signed)
CSN: [id number]     Arrival date & time 07/22/14  1949 History   First MD Initiated Contact with Patient 07/22/14 2052     Chief Complaint  Patient presents with  . Chest Pain     (Consider location/radiation/quality/duration/timing/severity/associated sxs/prior Treatment) HPI Justin Mcguire is a 29 year old male with past medical history of anxiety who presents the ER playing of chest discomfort. Patient reports over the past month he has experienced a remittent chest discomfort which occurs throughout the day periodically. Patient denies any specific aggravating or alleviating factors. Patient states he only thing he notices aggravated is if he begins to think about it, and then the pain typically is a constant, "discomfort" which persists until he "stops thinking about it". Patient reports having some sinus symptoms of GERD in the past, and patient states he typically uses Zantac for her as needed. Patient states his signs and symptoms for the past month do not feel consistent with his reflux. Patient reports an occasional associated shortness of breath that is not persistent. Patient denies any associated exertional dyspnea, exertional chest pain. Patient denies any headache, blurred vision, dizziness, weakness, palpitations, nausea, vomiting, abdominal pain. Patient states he does have a history of anxiety, is currently not taking any medication for it as he has not been back to the provider who last prescribed him to clonazepam. Tonight patient states he believes that he may just be perseverating on this new feeling of chest discomfort, and "just wants to be checked out".  Past Medical History  Diagnosis Date  . Allergy   . Anxiety    Past Surgical History  Procedure Laterality Date  . Fracture surgery Left 2006    ankle   Family History  Problem Relation Age of Onset  . Diabetes Father    History  Substance Use Topics  . Smoking status: Current Some Day Smoker    Last Attempt to  Quit: 11/18/2011  . Smokeless tobacco: Never Used  . Alcohol Use: 18.0 oz/week    30 Standard drinks or equivalent per week     Comment: socially    Review of Systems  Constitutional: Negative for fever.  HENT: Negative for trouble swallowing.   Eyes: Negative for visual disturbance.  Respiratory: Negative for shortness of breath.   Cardiovascular: Positive for chest pain.  Gastrointestinal: Negative for nausea, vomiting and abdominal pain.  Genitourinary: Negative for dysuria.  Musculoskeletal: Negative for neck pain.  Skin: Negative for rash.  Neurological: Negative for dizziness, weakness and numbness.  Psychiatric/Behavioral: Negative.       Allergies  Bee venom  Home Medications   Prior to Admission medications   Medication Sig Start Date End Date Taking? Authorizing Provider  amphetamine-dextroamphetamine (ADDERALL) 10 MG tablet Take 1 tablet (10 mg total) by mouth 2 (two) times daily with a meal. 05/03/14  Yes Chelle Jeffery, PA-C  clonazePAM (KLONOPIN) 0.5 MG tablet Take 0.5-1 tablets (0.25-0.5 mg total) by mouth 2 (two) times daily as needed for anxiety. 05/03/14  Yes Chelle Jeffery, PA-C  Multiple Vitamin (MULTIVITAMIN WITH MINERALS) TABS tablet Take 1 tablet by mouth daily.   Yes Historical Provider, MD  pantoprazole (PROTONIX) 20 MG tablet Take 1 tablet (20 mg total) by mouth daily. 07/22/14   Ladona Mow, PA-C   BP 131/94 mmHg  Pulse 67  Temp(Src) 98.2 F (36.8 C) (Oral)  Resp 18  Ht  (1.803 m)  Wt 205 lb (92.987 kg)  BMI 28.60 kg/m2  SpO2 100% Physical Exam  Constitutional: He  is oriented to person, place, and time. He appears well-developed and well-nourished. No distress.  HENT:  Head: Normocephalic and atraumatic.  Mouth/Throat: Oropharynx is clear and moist. No oropharyngeal exudate.  Eyes: EOM are normal. Pupils are equal, round, and reactive to light. Right eye exhibits no discharge. Left eye exhibits no discharge. No scleral icterus.  Neck: Normal  range of motion.  Cardiovascular: Normal rate, regular rhythm, S1 normal, S2 normal and normal heart sounds.   No murmur heard. Pulmonary/Chest: Effort normal and breath sounds normal. No accessory muscle usage. No tachypnea. No respiratory distress.  Abdominal: Soft. There is no tenderness.  Musculoskeletal: Normal range of motion. He exhibits no edema or tenderness.  Neurological: He is alert and oriented to person, place, and time. He has normal strength. No cranial nerve deficit or sensory deficit. Coordination normal. GCS eye subscore is 4. GCS verbal subscore is 5. GCS motor subscore is 6.  Skin: Skin is warm and dry. No rash noted. He is not diaphoretic.  Psychiatric: He has a normal mood and affect.  Nursing note and vitals reviewed.   ED Course  Procedures (including critical care time) Labs Review Labs Reviewed  CBC - Abnormal; Notable for the following:    Hemoglobin 17.1 (*)    All other components within normal limits  BASIC METABOLIC PANEL - Abnormal; Notable for the following:    Glucose, Bld 100 (*)    All other components within normal limits  I-STAT TROPOININ, ED    Imaging Review Dg Chest 2 View  07/22/2014   CLINICAL DATA:  Patient has been having upper chest pain off and on for past month, he has been short of breath tonight  EXAM: CHEST  2 VIEW  COMPARISON:  None.  FINDINGS: The heart size and mediastinal contours are within normal limits. Both lungs are clear. No pleural effusion or pneumothorax. The visualized skeletal structures are unremarkable.  IMPRESSION: Normal chest radiographs.   Electronically Signed   By: Amie Portland M.D.   On: 07/22/2014 21:59     EKG Interpretation   Date/Time:  Monday Jul 22 2014 19:55:12 EDT Ventricular Rate:  90 PR Interval:  136 QRS Duration: 88 QT Interval:  325 QTC Calculation: 398 R Axis:   -81 Text Interpretation:  Sinus rhythm LAD, consider left anterior fascicular  block Consider right ventricular hypertrophy  Confirmed by Fayrene Fearing  MD, MARK  [phone]) on 07/22/2014 10:53:59 PM      MDM   Final diagnoses:  Atypical chest pain  Anxiousness   Patient here with atypical chest pain, consistent with anxiety versus GERD. Chest pain is not likely of cardiac or pulmonary etiology d/t presentation, perc negative, VSS, no tracheal deviation, no JVD or new murmur, RRR, breath sounds equal bilaterally, EKG without acute abnormalities, negative troponin, and negative CXR. Pt has been advised start a PPI and return to the ED is CP becomes exertional, associated with diaphoresis or nausea, radiates to left jaw/arm, worsens or becomes concerning in any way. Pt appears reliable for follow up and is agreeable to discharge. Patient afebrile, hemodynamically stable and in no acute distress. Patient states symptoms have resolved after therapy here. Patient stable for discharge. Strongly encouraged follow-up with PCP, discussed return precautions, patient verbalizes understanding and agreement of this plan.  BP 131/94 mmHg  Pulse 67  Temp(Src) 98.2 F (36.8 C) (Oral)  Resp 18  Ht  (1.803 m)  Wt 205 lb (92.987 kg)  BMI 28.60 kg/m2  SpO2 100%  Signed,  Ladona MowJoe Tiaria Biby, PA-C 1:52 AM  Patient discussed with Dr. Rolland PorterMark James, MD   Ladona MowJoe Jaylee Lantry, PA-C 07/23/14 [id number]  Rolland PorterMark James, MD 07/31/14 [phone]

## 2014-11-17 ENCOUNTER — Ambulatory Visit (INDEPENDENT_AMBULATORY_CARE_PROVIDER_SITE_OTHER): Payer: No Typology Code available for payment source | Admitting: Emergency Medicine

## 2014-11-17 VITALS — BP 110/82 | HR 74 | Temp 98.2°F | Resp 18 | Ht 71.75 in | Wt 211.6 lb

## 2014-11-17 DIAGNOSIS — J0140 Acute pansinusitis, unspecified: Secondary | ICD-10-CM | POA: Diagnosis not present

## 2014-11-17 DIAGNOSIS — J209 Acute bronchitis, unspecified: Secondary | ICD-10-CM

## 2014-11-17 MED ORDER — AMOXICILLIN-POT CLAVULANATE 875-125 MG PO TABS: 1.0000 | ORAL_TABLET | Freq: Two times a day (BID) | ORAL | Status: DC

## 2014-11-17 MED ORDER — PSEUDOEPHEDRINE-GUAIFENESIN ER 60-600 MG PO TB12: 1.0000 | ORAL_TABLET | Freq: Two times a day (BID) | ORAL | Status: DC

## 2014-11-17 MED ORDER — HYDROCOD POLST-CPM POLST ER 10-8 MG/5ML PO SUER: 5.0000 mL | Freq: Two times a day (BID) | ORAL | Status: DC

## 2014-11-17 NOTE — Progress Notes (Signed)
Subjective:  Patient ID: Justin Mcguire, male    DOB: 1985/12/23  Age: 29 y.o. MRN: [id number]  CC: Cough and Facial Pain   HPI Justin Mcguire presents  with nasal congestion postnasal drainage and cough productive of mucopurulent sputum. His nasal discharge is purulent. He has no no fever or chills no wheezing or shortness of breath. He has no sore throat. Has some facial pressure and pain. Denies any nausea vomiting or stool change. Denies any rash. Has no improvement with over-the-counter medication  History Justin Mcguire has a past medical history of Allergy and Anxiety.   He has past surgical history that includes Fracture surgery (Left, 2006).   His  family history includes Diabetes in his father.  He   reports that he has been smoking.  He has never used smokeless tobacco. He reports that he drinks about 18.0 oz of alcohol per week. He reports that he uses illicit drugs (Marijuana and Cocaine).  Outpatient Prescriptions Prior to Visit  Medication Sig Dispense Refill  . amphetamine-dextroamphetamine (ADDERALL) 10 MG tablet Take 1 tablet (10 mg total) by mouth 2 (two) times daily with a meal. 60 tablet 0  . clonazePAM (KLONOPIN) 0.5 MG tablet Take 0.5-1 tablets (0.25-0.5 mg total) by mouth 2 (two) times daily as needed for anxiety. 30 tablet 0  . Multiple Vitamin (MULTIVITAMIN WITH MINERALS) TABS tablet Take 1 tablet by mouth daily.    . pantoprazole (PROTONIX) 20 MG tablet Take 1 tablet (20 mg total) by mouth daily. (Patient not taking: Reported on 11/17/2014) 30 tablet 0   No facility-administered medications prior to visit.    Social History   Social History  . Marital Status: Single    Spouse Name: N/A  . Number of Children: 0  . Years of Education: 12+   Occupational History  . [employer]   Social History Main Topics  . Smoking status: Current Some Day Smoker    Last Attempt to Quit: 11/18/2011  . Smokeless tobacco: Never Used  . Alcohol Use: 18.0  oz/week    30 Standard drinks or equivalent per week     Comment: socially  . Drug Use: Yes    Special: Marijuana, Cocaine  . Sexual Activity:    Partners: Female    Pharmacist, hospital Protection: Condom     Comment: inconsistently   Other Topics Concern  . None   Social History Narrative   Lives alone. Family lives in [place].     Review of Systems  Constitutional: Negative for fever, chills and appetite change.  HENT: Positive for ear pain, postnasal drip, rhinorrhea and sinus pressure. Negative for congestion and sore throat.   Eyes: Negative for pain and redness.  Respiratory: Positive for cough. Negative for shortness of breath and wheezing.   Cardiovascular: Negative for leg swelling.  Gastrointestinal: Negative for nausea, vomiting, abdominal pain, diarrhea, constipation and blood in stool.  Endocrine: Negative for polyuria.  Genitourinary: Negative for dysuria, urgency, frequency and flank pain.  Musculoskeletal: Negative for gait problem.  Skin: Negative for rash.  Neurological: Negative for weakness and headaches.  Psychiatric/Behavioral: Negative for confusion and decreased concentration. The patient is not nervous/anxious.     Objective:  BP 110/82 mmHg  Pulse 74  Temp(Src) 98.2 F (36.8 C) (Oral)  Resp 18  Ht 5' 11.75" (1.822 m)  Wt 211 lb 9.6 oz (95.981 kg)  BMI 28.91 kg/m2  SpO2 99%  Physical Exam  Constitutional: He is oriented to  person, place, and time. He appears well-developed and well-nourished. No distress.  HENT:  Head: Normocephalic and atraumatic.  Right Ear: External ear normal.  Left Ear: External ear normal.  Nose: Nose normal.  Eyes: Conjunctivae and EOM are normal. Pupils are equal, round, and reactive to light. No scleral icterus.  Neck: Normal range of motion. Neck supple. No tracheal deviation present.  Cardiovascular: Normal rate, regular rhythm and normal heart sounds.   Pulmonary/Chest: Effort normal. No respiratory distress. He  has no wheezes. He has no rales.  Abdominal: He exhibits no mass. There is no tenderness. There is no rebound and no guarding.  Musculoskeletal: He exhibits no edema.  Lymphadenopathy:    He has no cervical adenopathy.  Neurological: He is alert and oriented to person, place, and time. Coordination normal.  Skin: Skin is warm and dry. No rash noted.  Psychiatric: He has a normal mood and affect. His behavior is normal.      Assessment & Plan:   Justin Mcguire was seen today for cough and facial pain.  Diagnoses and all orders for this visit:  Acute bronchitis, unspecified organism  Acute pansinusitis, recurrence not specified  Other orders -     amoxicillin-clavulanate (AUGMENTIN) 875-125 MG per tablet; Take 1 tablet by mouth 2 (two) times daily. -     pseudoephedrine-guaifenesin (MUCINEX D) 60-600 MG per tablet; Take 1 tablet by mouth every 12 (twelve) hours. -     chlorpheniramine-HYDROcodone (TUSSIONEX PENNKINETIC ER) 10-8 MG/5ML SUER; Take 5 mLs by mouth 2 (two) times daily.  I am having Mr. Shahan start on amoxicillin-clavulanate, pseudoephedrine-guaifenesin, and chlorpheniramine-HYDROcodone. I am also having him maintain his amphetamine-dextroamphetamine, clonazePAM, multivitamin with minerals, and pantoprazole.  Meds ordered this encounter  Medications  . amoxicillin-clavulanate (AUGMENTIN) 875-125 MG per tablet    Sig: Take 1 tablet by mouth 2 (two) times daily.    Dispense:  20 tablet    Refill:  0  . pseudoephedrine-guaifenesin (MUCINEX D) 60-600 MG per tablet    Sig: Take 1 tablet by mouth every 12 (twelve) hours.    Dispense:  18 tablet    Refill:  0  . chlorpheniramine-HYDROcodone (TUSSIONEX PENNKINETIC ER) 10-8 MG/5ML SUER    Sig: Take 5 mLs by mouth 2 (two) times daily.    Dispense:  60 mL    Refill:  0    Appropriate red flag conditions were discussed with the patient as well as actions that should be taken.  Patient expressed his understanding.  Follow-up: Return  if symptoms worsen or fail to improve.  Carmelina Dane, MD

## 2014-11-17 NOTE — Patient Instructions (Signed)

## 2015-06-22 ENCOUNTER — Ambulatory Visit (INDEPENDENT_AMBULATORY_CARE_PROVIDER_SITE_OTHER): Payer: BLUE CROSS/BLUE SHIELD | Admitting: Family Medicine

## 2015-06-22 VITALS — BP 134/90 | HR 79 | Temp 97.5°F | Resp 12 | Ht 71.0 in | Wt 207.4 lb

## 2015-06-22 DIAGNOSIS — F988 Other specified behavioral and emotional disorders with onset usually occurring in childhood and adolescence: Secondary | ICD-10-CM

## 2015-06-22 DIAGNOSIS — F909 Attention-deficit hyperactivity disorder, unspecified type: Secondary | ICD-10-CM | POA: Diagnosis not present

## 2015-06-22 DIAGNOSIS — F419 Anxiety disorder, unspecified: Secondary | ICD-10-CM | POA: Diagnosis not present

## 2015-06-22 DIAGNOSIS — R079 Chest pain, unspecified: Secondary | ICD-10-CM

## 2015-06-22 MED ORDER — AMPHETAMINE-DEXTROAMPHETAMINE 10 MG PO TABS: 10.0000 mg | ORAL_TABLET | Freq: Two times a day (BID) | ORAL | Status: DC

## 2015-06-22 MED ORDER — CLONAZEPAM 0.5 MG PO TABS: 0.2500 mg | ORAL_TABLET | Freq: Two times a day (BID) | ORAL | Status: DC | PRN

## 2015-06-22 NOTE — Patient Instructions (Signed)
[facility] Policy for Prescribing Controlled Substances (Revised 12/2011) 1. Prescriptions for controlled substances will be filled by ONE provider at [facility] with whom you have established and developed a plan for your care, including follow-up. 2. You are encouraged to schedule an appointment with your prescriber at our appointment center for follow-up visits whenever possible. 3. If you request a prescription for the controlled substance while at [facility] for an acute problem (with someone other than your regular prescriber), you MAY be given a ONE-TIME prescription for a 30-day supply of the controlled substance, to allow time for you to return to see your regular prescriber for additional prescriptions.  Panic Attacks Panic attacks are sudden, short-livedsurges of severe anxiety, fear, or discomfort. They may occur for no reason when you are relaxed, when you are anxious, or when you are sleeping. Panic attacks may occur for a number of reasons:   Healthy people occasionally have panic attacks in extreme, life-threatening situations, such as war or natural disasters. Normal anxiety is a protective mechanism of the body that helps us react to danger (fight or flight response).  Panic attacks are often seen with anxiety disorders, such as panic disorder, social anxiety disorder, generalized anxiety disorder, and phobias. Anxiety disorders cause excessive or uncontrollable anxiety. They may interfere with your relationships or other life activities.  Panic attacks are sometimes seen with other mental illnesses, such as depression and posttraumatic stress disorder.  Certain medical conditions, prescription medicines, and drugs of abuse can cause panic attacks. SYMPTOMS  Panic attacks start suddenly, peak within 20 minutes, and are accompanied by four or more of the following symptoms:  Pounding heart or fast heart rate (palpitations).  Sweating.  Trembling or shaking.  Shortness of breath or feeling  smothered.  Feeling choked.  Chest pain or discomfort.  Nausea or strange feeling in your stomach.  Dizziness, light-headedness, or feeling like you will faint.  Chills or hot flushes.  Numbness or tingling in your lips or hands and feet.  Feeling that things are not real or feeling that you are not yourself.  Fear of losing control or going crazy.  Fear of dying. Some of these symptoms can mimic serious medical conditions. For example, you may think you are having a heart attack. Although panic attacks can be very scary, they are not life threatening. DIAGNOSIS  Panic attacks are diagnosed through an assessment by your health care provider. Your health care provider will ask questions about your symptoms, such as where and when they occurred. Your health care provider will also ask about your medical history and use of alcohol and drugs, including prescription medicines. Your health care provider may order blood tests or other studies to rule out a serious medical condition. Your health care provider may refer you to a mental health professional for further evaluation. TREATMENT   Most healthy people who have one or two panic attacks in an extreme, life-threatening situation will not require treatment.  The treatment for panic attacks associated with anxiety disorders or other mental illness typically involves counseling with a mental health professional, medicine, or a combination of both. Your health care provider will help determine what treatment is best for you.  Panic attacks due to physical illness usually go away with treatment of the illness. If prescription medicine is causing panic attacks, talk with your health care provider about stopping the medicine, decreasing the dose, or substituting another medicine.  Panic attacks due to alcohol or drug abuse go away with abstinence. Some adults  need professional help in order to stop drinking or using drugs. HOME CARE INSTRUCTIONS    Take all medicines as directed by your health care provider.   Schedule and attend follow-up visits as directed by your health care provider. It is important to keep all your appointments. SEEK MEDICAL CARE IF:  You are not able to take your medicines as prescribed.  Your symptoms do not improve or get worse. SEEK IMMEDIATE MEDICAL CARE IF:   You experience panic attack symptoms that are different than your usual symptoms.  You have serious thoughts about hurting yourself or others.  You are taking medicine for panic attacks and have a serious side effect. MAKE SURE YOU:  Understand these instructions.  Will watch your condition.  Will get help right away if you are not doing well or get worse.   This information is not intended to replace advice given to you by your health care provider. Make sure you discuss any questions you have with your health care provider.   Document Released: 02/15/2005 Document Revised: 02/20/2013 Document Reviewed: 09/29/2012 Elsevier Interactive Patient Education [employer]

## 2015-06-22 NOTE — Progress Notes (Signed)
Subjective:    Patient ID: Justin Mcguire, male    DOB: 08-08-1985, 30 y.o.   MRN: [id number] By signing my name below, I, Javier Dockerobert Ryan Halas, attest that this documentation has been prepared under the direction and in the presence of Norberto SorensonEva Lelend Heinecke, MD. Electronically Signed: Javier Dockerobert Ryan Halas, ER Scribe. 06/22/2015. 2:10 PM.  Chief Complaint  Patient presents with  . Panic Attack    HPI HPI Comments: Justin Mcguire is a 30 y.o. male with a past hx of anxiety who [facility] of intermittent panic attack sx for the last three months. He has had these sx in the past and has been prescribed klonopin. He states he has an uncomfortable sensation in his chest that makes him anxious because he thinks he is having a heart attack. He has a past hx of GERD. He has been established with a psychiatrist in the past, but did not like either provider so discontinued his care with both of them. He denies any depression sx. He has had anxiety since he was 17. He states he worries all the time about everything. He states he is able to manage his anxiety sx most of the time, but it occasionally gets severe and unmanageable. He does not have a PCP but only comes to this practice.   On recheck he was still tremulous and asked about the possibility of him having a heart attack or heart disease.    Past Medical History  Diagnosis Date  . Allergy   . Anxiety    Allergies  Allergen Reactions  . Bee Venom Swelling   Current Outpatient Prescriptions on File Prior to Visit  Medication Sig Dispense Refill  . Multiple Vitamin (MULTIVITAMIN WITH MINERALS) TABS tablet Take 1 tablet by mouth daily.    Marland Kitchen. amphetamine-dextroamphetamine (ADDERALL) 10 MG tablet Take 1 tablet (10 mg total) by mouth 2 (two) times daily with a meal. (Patient not taking: Reported on 06/22/2015) 60 tablet 0  . clonazePAM (KLONOPIN) 0.5 MG tablet Take 0.5-1 tablets (0.25-0.5 mg total) by mouth 2 (two) times daily as needed for anxiety.  (Patient not taking: Reported on 06/22/2015) 30 tablet 0   No current facility-administered medications on file prior to visit.   [facility] 06/22/2015 11/17/2014  Decreased Interest 0 0  Down, Depressed, Hopeless 0 0  PHQ - 2 Score 0 0     Review of Systems  Constitutional: Positive for appetite change and fatigue. Negative for fever, chills, activity change and unexpected weight change.  Respiratory: Positive for chest tightness and shortness of breath. Negative for cough and wheezing.   Cardiovascular: Positive for chest pain and palpitations.  Gastrointestinal: Positive for abdominal pain (some heartburn occ). Negative for vomiting.  Neurological: Negative for weakness.  Psychiatric/Behavioral: Positive for behavioral problems, sleep disturbance, decreased concentration and agitation. Negative for suicidal ideas, confusion and dysphoric mood. The patient is nervous/anxious.        Objective:  BP 147/86 mmHg  Pulse 79  Temp(Src) 97.5 F (36.4 C) (Oral)  Resp 12  Ht 5\' 11"  (1.803 m)  Wt 207 lb 6 oz (94.065 kg)  BMI 28.94 kg/m2  SpO2 98%  Physical Exam  Constitutional: He is oriented to person, place, and time. He appears well-developed and well-nourished. No distress.  HENT:  Head: Normocephalic and atraumatic.  Eyes: Pupils are equal, round, and reactive to light.  Neck: Neck supple.  Cardiovascular: Normal rate, regular rhythm and normal heart sounds.   No murmur heard.  Pulmonary/Chest: Effort normal. No respiratory distress. He has no wheezes. He exhibits no tenderness.  Musculoskeletal: Normal range of motion.  Neurological: He is alert and oriented to person, place, and time. Coordination normal.  Skin: Skin is warm and dry. He is not diaphoretic.  Psychiatric: He has a normal mood and affect. His behavior is normal.  Nursing note and vitals reviewed. repeat BP 130/90     Assessment & Plan:   Drug database checked, and no controlled substances  filled in the last six months. In the past year and a half he has filled one prescription for a controlled substance - which was same as below - from one of my colleagues 1. ADD (attention deficit disorder)   2. Anxiety   3. Chest pain, unspecified chest pain type    Pt is INCREDIBLY anxious on exam - even after I gave him his rxs below he was still on the verge of panic - tremulous, hyperventilating  - declined a xanax in officewas only able to calm down slightly when I reassured him that this cardiac exam was completley normal and he was not at any risk of having a life-threatening cardiac disease currently.  Advised to call in 3 wks - if doing ok on below will provide 1 refill then pt should f/u in OV w/ me in 2 mos from now - if still doing well, will transition to 3 mos of rxs at a time. Pt readily agrees to UDS today or whenever needed - consider at f/u (very expensive for pt).  Pt offers to sign a release to get prior records from [facility] from last yr - will do at f/u.  Meds ordered this encounter  Medications  . clonazePAM (KLONOPIN) 0.5 MG tablet    Sig: Take 0.5-1 tablets (0.25-0.5 mg total) by mouth 2 (two) times daily as needed for anxiety.    Dispense:  60 tablet    Refill:  0  . amphetamine-dextroamphetamine (ADDERALL) 10 MG tablet    Sig: Take 1 tablet (10 mg total) by mouth 2 (two) times daily with a meal.    Dispense:  60 tablet    Refill:  0    I personally performed the services described in this documentation, which was scribed in my presence. The recorded information has been reviewed and considered, and addended by me as needed.  Norberto Sorenson, MD MPH

## 2015-08-21 ENCOUNTER — Encounter: Payer: Self-pay | Admitting: Family Medicine

## 2015-08-21 ENCOUNTER — Ambulatory Visit (INDEPENDENT_AMBULATORY_CARE_PROVIDER_SITE_OTHER): Payer: BLUE CROSS/BLUE SHIELD | Admitting: Family Medicine

## 2015-08-21 VITALS — BP 130/90 | HR 83 | Temp 97.6°F | Resp 16 | Ht 71.0 in | Wt 214.0 lb

## 2015-08-21 DIAGNOSIS — M791 Myalgia: Secondary | ICD-10-CM | POA: Diagnosis not present

## 2015-08-21 DIAGNOSIS — F419 Anxiety disorder, unspecified: Secondary | ICD-10-CM | POA: Diagnosis not present

## 2015-08-21 DIAGNOSIS — F909 Attention-deficit hyperactivity disorder, unspecified type: Secondary | ICD-10-CM

## 2015-08-21 DIAGNOSIS — Z5181 Encounter for therapeutic drug level monitoring: Secondary | ICD-10-CM | POA: Diagnosis not present

## 2015-08-21 DIAGNOSIS — K219 Gastro-esophageal reflux disease without esophagitis: Secondary | ICD-10-CM

## 2015-08-21 DIAGNOSIS — M7918 Myalgia, other site: Secondary | ICD-10-CM

## 2015-08-21 DIAGNOSIS — F988 Other specified behavioral and emotional disorders with onset usually occurring in childhood and adolescence: Secondary | ICD-10-CM

## 2015-08-21 DIAGNOSIS — M546 Pain in thoracic spine: Secondary | ICD-10-CM | POA: Diagnosis not present

## 2015-08-21 DIAGNOSIS — G8929 Other chronic pain: Secondary | ICD-10-CM | POA: Diagnosis not present

## 2015-08-21 MED ORDER — AMPHETAMINE-DEXTROAMPHETAMINE 20 MG PO TABS: 20.0000 mg | ORAL_TABLET | Freq: Every day | ORAL | Status: DC

## 2015-08-21 MED ORDER — BUSPIRONE HCL 7.5 MG PO TABS: 7.5000 mg | ORAL_TABLET | Freq: Two times a day (BID) | ORAL | Status: DC

## 2015-08-21 MED ORDER — CLONAZEPAM 0.5 MG PO TABS: 0.5000 mg | ORAL_TABLET | Freq: Two times a day (BID) | ORAL | Status: DC | PRN

## 2015-08-21 NOTE — Patient Instructions (Signed)
     IF you received an x-ray today, you will receive an invoice from [facility]. Please contact [facility] at [phone] with questions or concerns regarding your invoice.   IF you received labwork today, you will receive an invoice from [facility]. Please contact [facility] at [phone] with questions or concerns regarding your invoice.   Our billing staff will not be able to assist you with questions regarding bills from these companies.  You will be contacted with the lab results as soon as they are available. The fastest way to get your results is to activate your My Chart account. Instructions are located on the last page of this paperwork. If you have not heard from us regarding the results in 2 weeks, please contact this office.      

## 2015-08-21 NOTE — Progress Notes (Signed)
Subjective:    Patient ID: Justin Mcguire, male    DOB: [birth year], 30 y.o.   MRN: [id number]  HPI   Justin Mcguire is a 30 yo male here today to follow-up on his severe anxiety and ADD.  I first met him 2 mos prior. He has been on medication intermittently for years but has always had trouble f The klonopin is like a miracle - it helps so much. He uses it every 2-3d and feels like there is a placebo component to it. It usually works right away and lasts until another attack. Really doesn't feel like the adderall is worsening his anxiety at all - he is having to take 2 to feel it working. He is still hving chest tightness and radiates up to his neck. He gets a pain in h is back which he worries about and will precipitate attacks when worsen. Has not been associated with movement or position.  He does have GERD and in his back it will burn some. He does not take anything for the gerd - tums okk.  He did try a 2 wk course of a ppi prior which did not help. No depressed, no trouble sleeping. Worked till 4 am No otc meds, no nsiads. He has tried sev anti-depressants before but they made hm worse - prozac, zoloft. tob use very rare Uses the adderall before work - with 1 he would feel it come off shortly and feel weird but with 2 it lasts longer.  Past Medical History  Diagnosis Date  . Allergy   . Anxiety    Past Surgical History  Procedure Laterality Date  . Fracture surgery Left 2006    ankle   Current Outpatient Prescriptions on File Prior to Visit  Medication Sig Dispense Refill  . Multiple Vitamin (MULTIVITAMIN WITH MINERALS) TABS tablet Take 1 tablet by mouth daily.     No current facility-administered medications on file prior to visit.   Allergies  Allergen Reactions  . Bee Venom Swelling   Family History  Problem Relation Age of Onset  . Diabetes Father    Social History   Social History  . Marital Status: Single    Spouse Name: N/A  . Number of Children: 0  . Years of  Education: 12+   Occupational History  . Bar T[employer]   Social History Main Topics  . Smoking status: Current Some Day Smoker    Last Attempt to Quit: 11/18/2011  . Smokeless tobacco: Never Used  . Alcohol Use: 18.0 oz/week    30 Standard drinks or equivalent per week     Comment: socially  . Drug Use: Yes    Special: Marijuana, Cocaine  . Sexual Activity:    Partners: Female    Patent examiner Protection: Condom     Comment: inconsistently   Other Topics Concern  . None   Social History Narrative   Lives alone. Family lives in [place].    Review of Systems  Constitutional: Positive for activity change (started going to gym).  Respiratory: Negative for cough, chest tightness and shortness of breath.   Cardiovascular: Positive for chest pain. Negative for palpitations and leg swelling.  Gastrointestinal: Positive for abdominal pain.  Musculoskeletal: Positive for back pain. Negative for myalgias, joint swelling, arthralgias and gait problem.  Allergic/Immunologic: Negative for immunocompromised state.  Neurological: Negative for dizziness, weakness, light-headedness and numbness.  Psychiatric/Behavioral: Positive for sleep disturbance, decreased concentration and agitation. Negative for suicidal ideas,  behavioral problems, confusion, self-injury and dysphoric mood. The patient is nervous/anxious.        Objective:  BP 130/90 mmHg  Pulse 83  Temp(Src) 97.6 F (36.4 C) (Oral)  Resp 16  Ht '5\' 11"'  (1.803 m)  Wt 214 lb (97.07 kg)  BMI 29.86 kg/m2  Physical Exam  Constitutional: He is oriented to person, place, and time. He appears well-developed and well-nourished. No distress.  HENT:  Head: Normocephalic and atraumatic.  Eyes: Conjunctivae are normal. Pupils are equal, round, and reactive to light. No scleral icterus.  Neck: Normal range of motion. Neck supple. No thyromegaly present.  Cardiovascular: Normal rate, regular rhythm, normal heart sounds  and intact distal pulses.   Pulmonary/Chest: Effort normal and breath sounds normal. No respiratory distress.  Abdominal: Normal appearance and bowel sounds are normal. There is no hepatosplenomegaly. There is no tenderness. There is no rebound, no CVA tenderness and negative Murphy's sign.  Musculoskeletal: He exhibits no edema.  Lymphadenopathy:    He has no cervical adenopathy.  Neurological: He is alert and oriented to person, place, and time.  Skin: Skin is warm and dry. He is not diaphoretic.  Psychiatric: His speech is normal and behavior is normal. Judgment and thought content normal. His mood appears anxious. Cognition and memory are normal.         [facility] reading (PRIMARY) by  Dr. Brigitte Pulse. EKG:no acute ischemic change or arrhythmia. Unchanged from prior 07/22/2014 with left anterior fascicular block unchanged  Assessment & Plan:   1. ADD (attention deficit disorder) - doing all right on adderall IR 35m prior to work so cont on same dose for now. At some point, will likely want to change to an XR formulation.  2. Anxiety - failed several ssris prior stating they made depression worse so will try buspar, encouraged pt to use klonopin to treat side effects in the short term to try to stay on it so side effects go away and then expect will need to wean up.  3. Rhomboid myalgia - reviewed stretching with foam roller.  Refer to [facility] for massage  4. Chronic left-sided thoracic back pain - if does not resolve with above, may want to do RUQ abd UKoreato r/o gallstones  5. Gastroesophageal reflux disease, esophagitis presence not specified - h. Pylori - start ppi.  6. Medication monitoring encounter     Orders Placed This Encounter  Procedures  . H. pylori breath test  . Ambulatory referral to Physical Therapy    Referral Priority:  Routine    Referral Type:  Physical Medicine    Referral Reason:  Specialty Services Required    Requested Specialty:  Physical Therapy     Number of Visits Requested:  1  . EKG 12-Lead    Meds ordered this encounter  Medications  . busPIRone (BUSPAR) 7.5 MG tablet    Sig: Take 1 tablet (7.5 mg total) by mouth 2 (two) times daily.    Dispense:  60 tablet    Refill:  1  . amphetamine-dextroamphetamine (ADDERALL) 20 MG tablet    Sig: Take 1 tablet (20 mg total) by mouth daily with breakfast.    Dispense:  30 tablet    Refill:  0  . clonazePAM (KLONOPIN) 0.5 MG tablet    Sig: Take 1 tablet (0.5 mg total) by mouth 2 (two) times daily as needed for anxiety.    Dispense:  60 tablet    Refill:  1    May refill no  sooner than 28 days after initial fill  . amphetamine-dextroamphetamine (ADDERALL) 20 MG tablet    Sig: Take 1 tablet (20 mg total) by mouth daily.    Dispense:  30 tablet    Refill:  0    May fill in 30 days from date written    Delman Cheadle, M.D.  [facility] [address] [place], [place] [address] [phone] phone [phone] fax  08/22/2015 7:47 AM

## 2015-08-22 ENCOUNTER — Encounter: Payer: Self-pay | Admitting: Family Medicine

## 2015-08-22 LAB — H. PYLORI BREATH TEST: H. pylori Breath Test: NOT DETECTED

## 2015-08-22 MED ORDER — OMEPRAZOLE 40 MG PO CPDR: 40.0000 mg | DELAYED_RELEASE_CAPSULE | Freq: Every day | ORAL | Status: DC

## 2015-10-18 ENCOUNTER — Other Ambulatory Visit: Payer: Self-pay | Admitting: Family Medicine

## 2015-11-28 ENCOUNTER — Ambulatory Visit (INDEPENDENT_AMBULATORY_CARE_PROVIDER_SITE_OTHER): Payer: Self-pay | Admitting: Family Medicine

## 2015-11-28 VITALS — BP 124/80 | HR 77 | Temp 97.5°F | Resp 17 | Ht 71.5 in | Wt 209.0 lb

## 2015-11-28 DIAGNOSIS — N368 Other specified disorders of urethra: Secondary | ICD-10-CM

## 2015-11-28 DIAGNOSIS — F988 Other specified behavioral and emotional disorders with onset usually occurring in childhood and adolescence: Secondary | ICD-10-CM

## 2015-11-28 DIAGNOSIS — F419 Anxiety disorder, unspecified: Secondary | ICD-10-CM

## 2015-11-28 LAB — POCT URINALYSIS DIP (MANUAL ENTRY)
BILIRUBIN UA: NEGATIVE
Bilirubin, UA: NEGATIVE
Blood, UA: NEGATIVE
Glucose, UA: NEGATIVE
Leukocytes, UA: NEGATIVE
Nitrite, UA: NEGATIVE
PH UA: 5.5
PROTEIN UA: NEGATIVE
Spec Grav, UA: >=1.03
Urobilinogen, UA: 0.2

## 2015-11-28 LAB — POC MICROSCOPIC URINALYSIS (UMFC): Mucus: ABSENT

## 2015-11-28 MED ORDER — AMPHETAMINE-DEXTROAMPHETAMINE 20 MG PO TABS: 20.0000 mg | ORAL_TABLET | Freq: Every day | ORAL | 0 refills | Status: DC

## 2015-11-28 MED ORDER — MUPIROCIN 2 % EX OINT: 1.0000 "application " | TOPICAL_OINTMENT | Freq: Three times a day (TID) | CUTANEOUS | 1 refills | Status: DC

## 2015-11-28 MED ORDER — KETOCONAZOLE 2 % EX CREA: 1.0000 "application " | TOPICAL_CREAM | Freq: Two times a day (BID) | CUTANEOUS | 0 refills | Status: DC

## 2015-11-28 MED ORDER — CLONAZEPAM 0.5 MG PO TABS: 0.5000 mg | ORAL_TABLET | Freq: Two times a day (BID) | ORAL | 1 refills | Status: DC | PRN

## 2015-11-28 NOTE — Progress Notes (Addendum)
By signing my name below I, [name], attest that this documentation has been prepared under the direction and in the presence of Norberto Sorenson, MD. Electonically Signed. [name], Scribe 11/28/2015 at 3:45 PM  Subjective:    Patient ID: Justin Mcguire, male    DOB: [birth year], 30 y.o.   MRN: [id number]  Chief Complaint  Patient presents with  . Exposure to STD    dysuria     HPI Justin Mcguire is a 30 y.o. male who presents to the [facility] complaining of dysuria.  He has an area of irritation on his glans, next to the urethra that has been present x 1 wk. No drainage or discharge. No urinary sxs other than tenderness at the irritation area with voids. NOrmal bowels, no known STI exposure. Has not tried anything. Never anything similar prior. Normal bowels, normal testicles.  Has not felt like he needed the buspar nor did it help. Finds that if he has his klonopin with him it works as a Media planner and he can often prevent his panic attacks so sxs overall improving.  Patient Active Problem List   Diagnosis Date Noted  . BMI 29.0-29.9,adult 05/03/2014  . ADD (attention deficit disorder) 04/07/2012  . Anxiety 04/07/2012    Current Outpatient Prescriptions on File Prior to Visit  Medication Sig Dispense Refill  . amphetamine-dextroamphetamine (ADDERALL) 20 MG tablet Take 1 tablet (20 mg total) by mouth daily with breakfast. 30 tablet 0  . amphetamine-dextroamphetamine (ADDERALL) 20 MG tablet Take 1 tablet (20 mg total) by mouth daily. 30 tablet 0  . clonazePAM (KLONOPIN) 0.5 MG tablet Take 1 tablet (0.5 mg total) by mouth 2 (two) times daily as needed for anxiety. 60 tablet 1  . Multiple Vitamin (MULTIVITAMIN WITH MINERALS) TABS tablet Take 1 tablet by mouth daily.    Marland Kitchen omeprazole (PRILOSEC) 40 MG capsule Take 1 capsule (40 mg total) by mouth daily. 30 capsule 3  . busPIRone (BUSPAR) 7.5 MG tablet TAKE 1 TABLET (7.5 MG TOTAL) BY MOUTH 2 (TWO) TIMES DAILY.  (Patient not taking: Reported on 11/28/2015) 60 tablet 0   No current facility-administered medications on file prior to visit.     Allergies  Allergen Reactions  . Bee Venom Swelling    Depression screen [facility] 2/9 11/28/2015 08/21/2015 06/22/2015 11/17/2014  Decreased Interest 0 0 0 0  Down, Depressed, Hopeless 0 0 0 0  PHQ - 2 Score 0 0 0 0       Review of Systems  Gastrointestinal: Negative for abdominal pain, diarrhea and nausea.  Genitourinary: Positive for dysuria, genital sores and penile pain. Negative for decreased urine volume, difficulty urinating, discharge, enuresis, flank pain, frequency, hematuria, penile swelling, scrotal swelling, testicular pain and urgency.  Allergic/Immunologic: Negative for immunocompromised state.  Psychiatric/Behavioral: Positive for decreased concentration. Negative for agitation, behavioral problems, confusion, dysphoric mood and sleep disturbance. The patient is nervous/anxious. The patient is not hyperactive.        Objective:   Physical Exam  Constitutional: He is oriented to person, place, and time. He appears well-developed and well-nourished. No distress.  HENT:  Head: Normocephalic and atraumatic.  Eyes: No scleral icterus.  Pulmonary/Chest: Effort normal.  Genitourinary: Testes normal.    Right testis shows no mass, no swelling and no tenderness. Left testis shows no mass, no swelling and no tenderness. Penile erythema present. No penile tenderness. No discharge found.  Genitourinary Comments: From the 5 oclock to 9 oclock area around urethra on  the glans there was a small poorly defined area of erythema without warmth, discharge, induration, or fluctuance  Neurological: He is alert and oriented to person, place, and time.  Skin: Skin is warm and dry. He is not diaphoretic.  Psychiatric: He has a normal mood and affect. His behavior is normal.   BP 124/80 (BP Location: Right Arm, Patient Position: Sitting, Cuff Size: Normal)    Pulse 77   Temp 97.5 F (36.4 C) (Oral)   Resp 17   Ht 5' 11.5" (1.816 m)   Wt 209 lb (94.8 kg)   SpO2 98%   BMI 28.74 kg/m       Assessment & Plan:   1. Urethral irritation - unknown etiology - suspect just mild abrasion/irritation though could be tinea so try alternating mupirocin and ketoconazole  2. ADD (attention deficit disorder) - stable, meds refilled  3. Anxiety - doing much better, using klonopin rarely    Orders Placed This Encounter  Procedures  . GC/Chlamydia Probe Amp    Standing Status:   Future    Standing Expiration Date:   11/27/2016  . POCT Microscopic Urinalysis ([facility])  . POCT urinalysis dipstick    Meds ordered this encounter  Medications  . amphetamine-dextroamphetamine (ADDERALL) 20 MG tablet    Sig: Take 1 tablet (20 mg total) by mouth daily.    Dispense:  30 tablet    Refill:  0    May fill in 30 days from date written  . amphetamine-dextroamphetamine (ADDERALL) 20 MG tablet    Sig: Take 1 tablet (20 mg total) by mouth daily with breakfast.    Dispense:  30 tablet    Refill:  0  . amphetamine-dextroamphetamine (ADDERALL) 20 MG tablet    Sig: Take 1 tablet (20 mg total) by mouth daily.    Dispense:  30 tablet    Refill:  0    May fill 60d from date written  . clonazePAM (KLONOPIN) 0.5 MG tablet    Sig: Take 1 tablet (0.5 mg total) by mouth 2 (two) times daily as needed for anxiety.    Dispense:  60 tablet    Refill:  1    May refill no sooner than 28 days after initial fill  . mupirocin ointment (BACTROBAN) 2 %    Sig: Apply 1 application topically 3 (three) times daily.    Dispense:  22 g    Refill:  1  . ketoconazole (NIZORAL) 2 % cream    Sig: Apply 1 application topically 2 (two) times daily.    Dispense:  30 g    Refill:  0     Norberto SorensonEva Shaw, M.D.  [facility]  [place] [address] [place], [place] [address] [phone] phone [phone]) [phone] fax  12/01/15 12:23 PM

## 2015-11-28 NOTE — Patient Instructions (Addendum)
IF you received an x-ray today, you will receive an invoice from [facility]. Please contact [facility] at [phone] with questions or concerns regarding your invoice.   IF you received labwork today, you will receive an invoice from [facility]. Please contact [facility] at [phone] with questions or concerns regarding your invoice.   Our billing staff will not be able to assist you with questions regarding bills from these companies.  You will be contacted with the lab results as soon as they are available. The fastest way to get your results is to activate your My Chart account. Instructions are located on the last page of this paperwork. If you have not heard from us regarding the results in 2 weeks, please contact this office.     Jock Itch Jock itch (tinea cruris) is a fungal infection of the skin in the groin area. It is sometimes called ringworm, even though it is not caused by worms. It is caused by a fungus, which is a type of germ that thrives in dark, damp places. Jock itch causes a rash and itching in the groin and upper thigh area. It usually goes away in 2-3 weeks with treatment. CAUSES The fungus that causes jock itch may be spread by:  Touching a fungus infection elsewhere on your body--such as athlete's foot--and then touching your groin area.  Sharing towels or clothing with an infected person. RISK FACTORS Jock itch is most common in men and adolescent boys. This condition is more likely to develop from:  Being in hot, humid climates.  Wearing tight-fitting clothing or wet bathing suits for long periods of time.  Participating in sports.  Being overweight.  Having diabetes. SYMPTOMS Symptoms of jock itch may include:  A red, pink, or brown rash in the groin area. The rash may spread to the thighs, anus, and buttocks.  Dry and scaly skin on or around the rash.  Itchiness. DIAGNOSIS Most often, a health  care provider can make the diagnosis by looking at your rash. Sometimes, a scraping of the infected skin will be taken. This sample may be tested by looking at it under a microscope or by trying to grow the fungus from the sample (culture).  TREATMENT Treatment for this condition may include:  Antifungal medicine to kill the fungus. This may be in various forms:  Skin cream or ointment.  Medicine taken by mouth.  Skin cream or ointment to reduce the itching.  Compresses or medicated powders to dry the infected skin. HOME CARE INSTRUCTIONS  Take medicines only as directed by your health care provider. Apply skin creams or ointments exactly as directed.  Wear loose-fitting clothing.  Men should wear cotton boxer shorts.  Women should wear cotton underwear.  Change your underwear every day to keep your groin dry.  Avoid hot baths.  Dry your groin area well after bathing.  Use a separate towel to dry your groin area. This will help to prevent a spreading of the infection to other areas of your body.  Do not scratch the affected area.  Do not share towels with other people. SEEK MEDICAL CARE IF:  Your rash does not improve or it gets worse after 2 weeks of treatment.  Your rash is spreading.  Your rash returns after treatment is finished.  You have a fever.  You have redness, swelling, or pain in the area around your rash.  You have fluid, blood, or pus coming from your rash.  have your rash for more than 4 weeks.   This information is not intended to replace advice given to you by your health care provider. Make sure you discuss any questions you have with your health care provider.   Document Released: 02/05/2002 Document Revised: 03/08/2014 Document Reviewed: 11/27/2013 Elsevier Interactive Patient Education 2016 Elsevier Inc.  

## 2015-12-02 NOTE — Addendum Note (Signed)
Addended by: Cydney OkAUGUSTIN, Venida Tsukamoto N on: 12/02/2015 04:49 PM   Modules accepted: Orders

## 2015-12-03 LAB — GC/CHLAMYDIA PROBE AMP
CT PROBE, AMP APTIMA: NOT DETECTED
GC PROBE AMP APTIMA: NOT DETECTED

## 2015-12-30 ENCOUNTER — Ambulatory Visit (INDEPENDENT_AMBULATORY_CARE_PROVIDER_SITE_OTHER): Payer: Self-pay | Admitting: Family Medicine

## 2015-12-30 VITALS — BP 124/80 | HR 77 | Temp 97.2°F | Resp 18 | Ht 71.5 in | Wt 208.0 lb

## 2015-12-30 DIAGNOSIS — J019 Acute sinusitis, unspecified: Secondary | ICD-10-CM

## 2015-12-30 DIAGNOSIS — K0510 Chronic gingivitis, plaque induced: Secondary | ICD-10-CM

## 2015-12-30 MED ORDER — AMOXICILLIN 500 MG PO CAPS: 1000.0000 mg | ORAL_CAPSULE | Freq: Two times a day (BID) | ORAL | 0 refills | Status: DC

## 2015-12-30 MED ORDER — CHLORHEXIDINE GLUCONATE 0.12 % MT SOLN: 15.0000 mL | Freq: Two times a day (BID) | OROMUCOSAL | 0 refills | Status: DC

## 2015-12-30 NOTE — Progress Notes (Addendum)
Subjective:  This chart was scribed for Justin SorensonEva Shaw MD, by Veverly FellsHatice Demirci,scribe, at [facility].  This patient was seen in room 3 and the patient's care was started at 12:55 PM.   Chief Complaint  Patient presents with  . BLEEDING GUMS     Patient ID: Justin LintJustin Mcguire, male    DOB: 12-02-1985, 30 y.o.   MRN: [id number]  HPI HPI Comments: Justin LintJustin Veno is a 30 y.o. male who presents to the [facility] complaining of bleeding gums (all over) onset about 3 weeks ago. Yesterday, he had some increased pain in his gums as well.   He assumed that it was due to brushing "too hard" but states that his symptoms persisted.  His last dental visit was about 6 months ago. He denies any canker sores and does not use any specific mouth wash.   He has also had nasal congestion, postnasal drip, rhinorrhea and some coughing the past few days.  He denies any bloody sputum. He has not used any over the counter medications for these symptoms.   Anxiety is stable, mood sxs at baseline.   Past Medical History:  Diagnosis Date  . Allergy   . Anxiety     Current Outpatient Prescriptions on File Prior to Visit  Medication Sig Dispense Refill  . amphetamine-dextroamphetamine (ADDERALL) 20 MG tablet Take 1 tablet (20 mg total) by mouth daily. 30 tablet 0  . amphetamine-dextroamphetamine (ADDERALL) 20 MG tablet Take 1 tablet (20 mg total) by mouth daily with breakfast. 30 tablet 0  . amphetamine-dextroamphetamine (ADDERALL) 20 MG tablet Take 1 tablet (20 mg total) by mouth daily. 30 tablet 0  . clonazePAM (KLONOPIN) 0.5 MG tablet Take 1 tablet (0.5 mg total) by mouth 2 (two) times daily as needed for anxiety. 60 tablet 1  . ketoconazole (NIZORAL) 2 % cream Apply 1 application topically 2 (two) times daily. 30 g 0  . Multiple Vitamin (MULTIVITAMIN WITH MINERALS) TABS tablet Take 1 tablet by mouth daily.    . mupirocin ointment (BACTROBAN) 2 % Apply 1 application topically 3 (three)  times daily. 22 g 1  . omeprazole (PRILOSEC) 40 MG capsule Take 1 capsule (40 mg total) by mouth daily. 30 capsule 3   No current facility-administered medications on file prior to visit.     Allergies  Allergen Reactions  . Bee Venom Swelling      Review of Systems  Constitutional: Negative for chills and fever.  HENT: Positive for congestion, postnasal drip and rhinorrhea.   Eyes: Negative for pain, redness and itching.  Respiratory: Positive for cough. Negative for choking and shortness of breath.   Neurological: Negative for syncope and speech difficulty.       Objective:   Physical Exam  Constitutional: He is oriented to person, place, and time. He appears well-developed and well-nourished. No distress.  HENT:  Head: Normocephalic and atraumatic.  Eyes: Pupils are equal, round, and reactive to light.  Cardiovascular: Normal rate.   Pulmonary/Chest: Effort normal. No respiratory distress.  Neurological: He is alert and oriented to person, place, and time.  Skin: Skin is warm and dry.  Psychiatric: He has a normal mood and affect. His behavior is normal.   Vitals:   12/30/15 1149  BP: 124/80  Pulse: 77  Resp: 18  Temp: 97.2 F (36.2 C)  TempSrc: Oral  SpO2: 99%  Weight: 208 lb (94.3 kg)  Height: 5' 11.5" (1.816 m)  Assessment & Plan:   1. Gingivitis   2. Acute non-recurrent sinusitis, unspecified location   Pt is very anxious in general and he is clearly worried about an underlying disease/clotting disorder.  However, reassured pt that this is very unlikely considering no other sxs. Cover for infxn. F/u with dentist. If sxs persist, can check labs.  Meds ordered this encounter  Medications  . amoxicillin (AMOXIL) 500 MG capsule    Sig: Take 2 capsules (1,000 mg total) by mouth 2 (two) times daily.    Dispense:  40 capsule    Refill:  0  . chlorhexidine (PERIDEX) 0.12 % solution    Sig: Use as directed 15 mLs in the mouth or throat 2 (two) times  daily. Swish around mouth and spit out    Dispense:  120 mL    Refill:  0    I personally performed the services described in this documentation, which was scribed in my presence. The recorded information has been reviewed and considered, and addended by me as needed.   Justin SorensonEva Mcguire, M.D.  [facility]  [place] [address] [place], [place] [address] [phone] phone [phone] fax  12/30/15 1:31 PM

## 2015-12-30 NOTE — Patient Instructions (Addendum)
IF you received an x-ray today, you will receive an invoice from [facility]. Please contact [facility] at [phone] with questions or concerns regarding your invoice.   IF you received labwork today, you will receive an invoice from [facility]. Please contact [facility] at [phone] with questions or concerns regarding your invoice.   Our billing staff will not be able to assist you with questions regarding bills from these companies.  You will be contacted with the lab results as soon as they are available. The fastest way to get your results is to activate your My Chart account. Instructions are located on the last page of this paperwork. If you have not heard from us regarding the results in 2 weeks, please contact this office.    Gingivitis Gingivitis is a form of gum (periodontal) disease that causes redness, soreness, and swelling (inflammation) of your gums. CAUSES The most common cause of gingivitis is poor oral hygiene. A sticky substance made of bacteria, mucus, and food particles (plaque), is deposited on the exposed part of teeth. As plaque builds up, it reacts with the saliva in your mouth to form something called  tartar. Tartar is a hard deposit that becomes trapped around the base of the tooth. Plaque and tartar irritate the gums, leading to the formation of gingivitis. Other factors that increase your risk for gingivitis include:   Tobacco use.  Diabetes.  Older age.  Certain medications.  Certain viral or fungal infections.  Dry mouth.  Hormonal changes such as during pregnancy.  Poor nutrition.  Substance abuse.  Poor fitting dental restorations or appliances. SYMPTOMS You may notice inflammation of the soft tissue (gingiva) around the teeth. When these tissues become inflamed, they bleed easily, especially during flossing or brushing. The gums may also be:   Tender to the touch.  Bright red, purple  red, or have a shiny appearance.  Swollen.  Wearing away from the teeth (receding), which exposes more of the tooth. Bad breath is often present. Continued infection around teeth can eventually cause cavities and loosen teeth. This may lead to eventual tooth loss. DIAGNOSIS A medical and dental history will be taken. Your mouth, teeth, and gums will be examined. Your dentist will look for soft, swollen purple-red, irritated gums. There may be deposits of plaque and tartar at the base of the teeth. Your gums will be looked at for the degree of redness, puffiness, and bleeding tendencies. Your dentist will see if any of the teeth are loose. X-rays may be taken to see if the inflammation has spread to the supporting structures of the teeth. TREATMENT The goal is to reduce and reverse the inflammation. Proper treatment can usually reverse the symptoms of gingivitis and prevent further progression of the disease. Have your teeth cleaned. During the cleaning, all plaque and tartar will be removed. Instruction for proper home care will be given. You will need regular professional cleanings and check-ups in the future. HOME CARE INSTRUCTIONS  Brush your teeth twice a day and floss at least once per day. When flossing, it is best to floss first then brush.  Limit sugar between meals and maintain a well-balanced diet.  Even the best dental hygiene will not prevent plaque from developing. It is necessary for you to see your dentist on a regular basis for cleaning and regular checkups.  Your dentist can recommend proper oral hygiene and mouth care and suggest special toothpastes or mouth rinses.  Stop smoking. SEEK DENTAL  OR MEDICAL CARE IF:  You have painful, reddened tissue around your teeth, or you have puffy swollen gums.  You have difficulty chewing.  You notice any loose or infected teeth.  You have swollen glands.  Your gums bleed easily when you brush your teeth or are very tender to the  touch.   This information is not intended to replace advice given to you by your health care provider. Make sure you discuss any questions you have with your health care provider.   Document Released: 08/11/2000 Document Revised: 05/10/2011 Document Reviewed: 09/30/2014 Elsevier Interactive Patient Education [employer] Elsevier Inc.

## 2016-03-04 ENCOUNTER — Other Ambulatory Visit: Payer: Self-pay | Admitting: Family Medicine

## 2016-03-04 DIAGNOSIS — F419 Anxiety disorder, unspecified: Secondary | ICD-10-CM

## 2016-03-09 NOTE — Telephone Encounter (Signed)
Faxed to pharm 

## 2016-04-20 ENCOUNTER — Ambulatory Visit (INDEPENDENT_AMBULATORY_CARE_PROVIDER_SITE_OTHER): Payer: Self-pay | Admitting: Student

## 2016-04-20 VITALS — BP 112/78 | HR 78 | Temp 98.1°F | Resp 16 | Ht 71.25 in

## 2016-04-20 DIAGNOSIS — J0100 Acute maxillary sinusitis, unspecified: Secondary | ICD-10-CM | POA: Insufficient documentation

## 2016-04-20 DIAGNOSIS — J0101 Acute recurrent maxillary sinusitis: Secondary | ICD-10-CM

## 2016-04-20 DIAGNOSIS — N4889 Other specified disorders of penis: Secondary | ICD-10-CM

## 2016-04-20 MED ORDER — AMOXICILLIN-POT CLAVULANATE 875-125 MG PO TABS: 1.0000 | ORAL_TABLET | Freq: Two times a day (BID) | ORAL | 0 refills | Status: DC

## 2016-04-20 NOTE — Assessment & Plan Note (Addendum)
Continue augmentin for sinus infection and then nasal spray with flonase and loratadine for allergies.  If continues to be recurrent problem, can consider ENT referral.  For now, will treat infection and allergies.  Recommend against afrin due to rebound congestion.

## 2016-04-20 NOTE — Patient Instructions (Signed)
     IF you received an x-ray today, you will receive an invoice from [facility]. Please contact [facility] at [phone] with questions or concerns regarding your invoice.   IF you received labwork today, you will receive an invoice from [facility]. Please contact [facility] at [phone] with questions or concerns regarding your invoice.   Our billing staff will not be able to assist you with questions regarding bills from these companies.  You will be contacted with the lab results as soon as they are available. The fastest way to get your results is to activate your My Chart account. Instructions are located on the last page of this paperwork. If you have not heard from us regarding the results in 2 weeks, please contact this office.     

## 2016-04-20 NOTE — Progress Notes (Signed)
   Subjective:    Patient ID: Justin Mcguire, male    DOB: Jul 17, 1985, [age] y.o.   MRN: [id number]  HPI 3 weeks of coughing up phlegm, had fevers or chills, but not now.  +nasal congestion, no earaches. No myalgias, n/v/d/c. Denies chest pain, shortness of breath, palpitations.      Has a penile lump, no h/o STD's. No dysuria, penile discharge.  Monogamous with 1 partner  PMHx - Updated and reviewed.  Contributory factors include: Negative PSHx - Updated and reviewed.  Contributory factors include:  Negative FHx - Updated and reviewed.  Contributory factors include:  Negative Social Hx - Updated and reviewed. Contributory factors include: Negative Medications - reviewed    Review of Systems  Constitutional: Negative for chills, fatigue and fever.  HENT: Positive for congestion and sinus pressure. Negative for rhinorrhea and sore throat.   Eyes: Negative for pain and itching.  Respiratory: Positive for cough. Negative for shortness of breath and wheezing.   Cardiovascular: Negative for chest pain, palpitations and leg swelling.  Genitourinary: Positive for genital sores. Negative for decreased urine volume, difficulty urinating, discharge, dysuria, frequency, hematuria, penile pain, penile swelling, testicular pain and urgency.  Musculoskeletal: Negative for joint swelling and myalgias.  Skin: Negative for pallor, rash and wound.  Neurological: Negative for dizziness and weakness.  All other systems reviewed and are negative.      Objective:   Physical Exam  Constitutional: He is oriented to person, place, and time. He appears well-developed and well-nourished. No distress.  HENT:  Head: Normocephalic and atraumatic.  Right Ear: External ear normal.  Left Ear: External ear normal.  Mouth/Throat: Oropharynx is clear and moist. No oropharyngeal exudate.  Mild nasal turbinate edema with some drainage  Eyes: Conjunctivae are normal. No scleral icterus.  Neck: Normal range of motion. Neck  supple.  Cardiovascular: Normal rate and regular rhythm.  Exam reveals no gallop and no friction rub.   No murmur heard. Pulmonary/Chest: Effort normal and breath sounds normal. No respiratory distress. He has no wheezes. He has no rales.  Genitourinary:    No penile tenderness.  Musculoskeletal: He exhibits no edema or deformity.  Lymphadenopathy:    He has no cervical adenopathy.  Neurological: He is alert and oriented to person, place, and time.  Skin: Skin is warm. No rash noted. He is not diaphoretic. No erythema. No pallor.  Psychiatric: He has a normal mood and affect. His behavior is normal. Judgment and thought content normal.   BP 112/78 (BP Location: Right Arm, Patient Position: Sitting, Cuff Size: Small)   Pulse 78   Temp 98.1 F (36.7 C) (Oral)   Resp 16   Ht 5' 11.25" (1.81 m)   SpO2 97%         Assessment & Plan:  Sinusitis, acute maxillary Continue augmentin for sinus infection and then nasal spray with flonase and loratadine for allergies.  If continues to be recurrent problem, can consider ENT referral.  For now, will treat infection and allergies.  Recommend against afrin due to rebound congestion.  Epidermoid cyst of skin of penis Recommend no intervention, gave reassurance.  Follow up if having worsening symptoms, pain, swelling, increase in size.  Signed,  Corliss MarcusAlicia Barnes, DO [facility] 3:50 PM 04/20/16

## 2016-04-20 NOTE — Assessment & Plan Note (Addendum)
Recommend no intervention, gave reassurance.  Follow up if having worsening symptoms, pain, swelling, increase in size.

## 2016-05-02 ENCOUNTER — Other Ambulatory Visit: Payer: Self-pay | Admitting: Family Medicine

## 2016-05-02 DIAGNOSIS — F988 Other specified behavioral and emotional disorders with onset usually occurring in childhood and adolescence: Secondary | ICD-10-CM

## 2016-08-31 ENCOUNTER — Other Ambulatory Visit: Payer: Self-pay | Admitting: Family Medicine

## 2016-08-31 DIAGNOSIS — F419 Anxiety disorder, unspecified: Secondary | ICD-10-CM

## 2016-09-02 ENCOUNTER — Encounter: Payer: Self-pay | Admitting: Physician Assistant

## 2016-09-02 ENCOUNTER — Ambulatory Visit (INDEPENDENT_AMBULATORY_CARE_PROVIDER_SITE_OTHER): Payer: Self-pay | Admitting: Physician Assistant

## 2016-09-02 DIAGNOSIS — T148XXA Other injury of unspecified body region, initial encounter: Secondary | ICD-10-CM

## 2016-09-02 NOTE — Patient Instructions (Addendum)
Hematoma A hematoma is a collection of blood. The collection of blood can turn into a hard, painful lump under the skin. Your skin may turn blue or yellow if the hematoma is close to the surface of the skin. Most hematomas get better in a few days to weeks. Some hematomas are serious and need medical care. Hematomas can be very small or very big. Follow these instructions at home:  Apply ice to the injured area: ? Put ice in a plastic bag. ? Place a towel between your skin and the bag. ? Leave the ice on for 20 minutes, 2-3 times a day for the first 1 to 2 days.  After the first 2 days, switch to using warm packs on the injured area.  Raise (elevate) the injured area to lessen pain and puffiness (swelling). You may also wrap the area with an elastic bandage. Make sure the bandage is not wrapped too tight.  If you have a painful hematoma on your leg or foot, you may use crutches for a couple days.  Only take medicines as told by your doctor. Get help right away if:  Your pain gets worse.  Your pain is not controlled with medicine.  You have a fever.  Your puffiness gets worse.  Your skin turns more blue or yellow.  Your skin over the hematoma breaks or starts bleeding.  Your hematoma is in your chest or belly (abdomen) and you are short of breath, feel weak, or have a change in consciousness.  Your hematoma is on your scalp and you have a headache that gets worse or a change in alertness or consciousness. This information is not intended to replace advice given to you by your health care provider. Make sure you discuss any questions you have with your health care provider. Document Released: 03/25/2004 Document Revised: 07/24/2015 Document Reviewed: 07/26/2012 Elsevier Interactive Patient Education  2017 [employer]     IF you received an x-ray today, you will receive an invoice from [facility]. Please contact [facility] at [phone] with questions or  concerns regarding your invoice.   IF you received labwork today, you will receive an invoice from [facility]. Please contact [facility] at [phone] with questions or concerns regarding your invoice.   Our billing staff will not be able to assist you with questions regarding bills from these companies.  You will be contacted with the lab results as soon as they are available. The fastest way to get your results is to activate your My Chart account. Instructions are located on the last page of this paperwork. If you have not heard from us regarding the results in 2 weeks, please contact this office.

## 2016-09-03 LAB — CBC
Hematocrit: 48.5 % (ref 37.5–51.0)
Hemoglobin: 16.5 g/dL (ref 13.0–17.7)
MCH: 30.8 pg (ref 26.6–33.0)
MCHC: 34 g/dL (ref 31.5–35.7)
MCV: 91 fL (ref 79–97)
PLATELETS: 170 10*3/uL (ref 150–379)
RBC: 5.36 x10E6/uL (ref 4.14–5.80)
RDW: 14 % (ref 12.3–15.4)
WBC: 6 10*3/uL (ref 3.4–10.8)

## 2016-09-04 ENCOUNTER — Telehealth: Payer: Self-pay | Admitting: *Deleted

## 2016-09-04 NOTE — Telephone Encounter (Signed)
Faxed Rx for clonazepam 0.5 mg to [facility] on [address]. Confirmation page received at 4:23p.

## 2016-09-04 NOTE — Telephone Encounter (Signed)
mychart message sent to pt about making an appt °

## 2016-09-04 NOTE — Telephone Encounter (Signed)
Pt needs a f/u OV so we can continue to make sure the medicines are keeping him healthy and safe before we refill them further. Thanks.

## 2016-09-05 NOTE — Progress Notes (Signed)
PRIMARY CARE AT [facility] [address], [place] [phone]  Date:  09/02/2016   Name:  Justin LintJustin Mcguire   DOB:  01-May-1985   MRN:  [id number]  PCP:  Sherren MochaShaw, Eva N, MD    History of Present Illness:  Justin Mcguire is a 31 y.o. male patient who presents to PCP with  Chief Complaint  Patient presents with  . BRUISE    X 4 days back of left leg/thigh     4 days of left leg bruise.  Unknown trauma. He had noticed it as a small red mark but then it worsened.  No painful.  No pain with movement.  He has noticed no nose bleeds, or easy bruising.  He was seen 8 months here for gum bleeding.  Treated as gingivitis with chlorhexidine.  This has since resolved.  No familial bleeding disorders.  No use of aspirins.  No heavy etOH use.  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  Patient Active Problem List   Diagnosis Date Noted  . Sinusitis, acute maxillary 04/20/2016  . Epidermoid cyst of skin of penis 04/20/2016  . BMI 29.0-29.9,adult 05/03/2014  . ADD (attention deficit disorder) 04/07/2012  . Anxiety 04/07/2012    Past Medical History:  Diagnosis Date  . Allergy   . Anxiety     Past Surgical History:  Procedure Laterality Date  . FRACTURE SURGERY Left 2006   ankle    Social History  Substance Use Topics  . Smoking status: Current Some Day Smoker    Packs/day: 0.25    Years: 3.00  . Smokeless tobacco: Never Used  . Alcohol use 18.0 oz/week    30 Standard drinks or equivalent per week     Comment: socially    Family History  Problem Relation Age of Onset  . Diabetes Father     Allergies  Allergen Reactions  . Bee Venom Swelling    Medication list has been reviewed and updated.  Current Outpatient Prescriptions on File Prior to Visit  Medication Sig Dispense Refill  . amoxicillin-clavulanate (AUGMENTIN) 875-125 MG tablet Take 1 tablet by mouth 2 (two) times daily. 14 tablet 0  . amphetamine-dextroamphetamine (ADDERALL) 20 MG tablet Take 1 tablet (20 mg total) by mouth daily. 30  tablet 0  . amphetamine-dextroamphetamine (ADDERALL) 20 MG tablet Take 1 tablet (20 mg total) by mouth daily with breakfast. 30 tablet 0  . amphetamine-dextroamphetamine (ADDERALL) 20 MG tablet Take 1 tablet (20 mg total) by mouth daily. 30 tablet 0  . chlorhexidine (PERIDEX) 0.12 % solution Use as directed 15 mLs in the mouth or throat 2 (two) times daily. Swish around mouth and spit out 120 mL 0  . ketoconazole (NIZORAL) 2 % cream Apply 1 application topically 2 (two) times daily. 30 g 0  . Multiple Vitamin (MULTIVITAMIN WITH MINERALS) TABS tablet Take 1 tablet by mouth daily.    . mupirocin ointment (BACTROBAN) 2 % Apply 1 application topically 3 (three) times daily. 22 g 1  . omeprazole (PRILOSEC) 40 MG capsule Take 1 capsule (40 mg total) by mouth daily. 30 capsule 3  . clonazePAM (KLONOPIN) 0.5 MG tablet Take 1 tablet (0.5 mg total) by mouth 2 (two) times daily as needed for anxiety. **Needs office visit for any additional refills.** 60 tablet 0   No current facility-administered medications on file prior to visit.     ROS ROS otherwise unremarkable unless listed above.  Physical Examination: There were no vitals taken for this visit. Ideal Body Weight:  Physical Exam  Constitutional: He is oriented to person, place, and time. He appears well-developed and well-nourished. No distress.  HENT:  Head: Normocephalic and atraumatic.  Eyes: Pupils are equal, round, and reactive to light. Conjunctivae and EOM are normal.  Cardiovascular: Normal rate.   Pulmonary/Chest: Effort normal. No respiratory distress.  Neurological: He is alert and oriented to person, place, and time.  Skin: Skin is warm and dry. He is not diaphoretic.  3cm circular ecchymotic lesion non-raised tht is non-tender.  No nodules or mass appreciated.  Normal rrom of the lower extremity of the left leg.   Psychiatric: He has a normal mood and affect. His behavior is normal.     Assessment and Plan: Justin Mcguire is  a 31 y.o. male who is here today for cc of bruise on leg.  Will obtain platelet today. Advised heating. Bruise - Plan: CBC  Trena Platt, PA-C [facility] 7/13/201811:00 AM

## 2016-12-01 ENCOUNTER — Encounter: Payer: Self-pay | Admitting: Family Medicine

## 2016-12-01 ENCOUNTER — Ambulatory Visit (INDEPENDENT_AMBULATORY_CARE_PROVIDER_SITE_OTHER): Payer: Self-pay | Admitting: Family Medicine

## 2016-12-01 VITALS — BP 150/96 | HR 84 | Temp 98.5°F | Resp 16 | Ht 72.0 in | Wt 214.0 lb

## 2016-12-01 DIAGNOSIS — F988 Other specified behavioral and emotional disorders with onset usually occurring in childhood and adolescence: Secondary | ICD-10-CM

## 2016-12-01 DIAGNOSIS — F419 Anxiety disorder, unspecified: Secondary | ICD-10-CM

## 2016-12-01 DIAGNOSIS — F410 Panic disorder [episodic paroxysmal anxiety] without agoraphobia: Secondary | ICD-10-CM

## 2016-12-01 DIAGNOSIS — G2581 Restless legs syndrome: Secondary | ICD-10-CM

## 2016-12-01 MED ORDER — AMPHETAMINE-DEXTROAMPHETAMINE 20 MG PO TABS: 20.0000 mg | ORAL_TABLET | Freq: Every day | ORAL | 0 refills | Status: DC

## 2016-12-01 MED ORDER — PRAMIPEXOLE DIHYDROCHLORIDE 0.25 MG PO TABS: 0.2500 mg | ORAL_TABLET | Freq: Every evening | ORAL | 3 refills | Status: DC | PRN

## 2016-12-01 MED ORDER — CLONAZEPAM 0.5 MG PO TABS: 0.5000 mg | ORAL_TABLET | Freq: Two times a day (BID) | ORAL | 5 refills | Status: DC | PRN

## 2016-12-01 NOTE — Patient Instructions (Signed)
     IF you received an x-ray today, you will receive an invoice from [facility]. Please contact [facility] at [phone] with questions or concerns regarding your invoice.   IF you received labwork today, you will receive an invoice from [facility]. Please contact [facility] at [phone] with questions or concerns regarding your invoice.   Our billing staff will not be able to assist you with questions regarding bills from these companies.  You will be contacted with the lab results as soon as they are available. The fastest way to get your results is to activate your My Chart account. Instructions are located on the last page of this paperwork. If you have not heard from us regarding the results in 2 weeks, please contact this office.     

## 2016-12-01 NOTE — Progress Notes (Signed)
Subjective:    Patient ID: Justin Mcguire, male    DOB: 1985-04-19, 31 y.o.   MRN: [id number]  Chief Complaint  Patient presents with  . Follow-up    ADD  . Medication Refill    Adderall 20 mg    HPI  He knew he was going to have a panic attack this week as he was out of medication and happened at work yesterday so here today - anxiety has been bad for the past few days.  Has been taken about 1 day when anxiety really flaires up - rarely two - and then has waves where he feels ok.  Adderall gives him a little ambition - doesn't make panic worse and does sleep ok.  He gets RLS at night and will take a klonopin to help relieve that.    Past Medical History:  Diagnosis Date  . Allergy   . Anxiety    Past Surgical History:  Procedure Laterality Date  . FRACTURE SURGERY Left 2006   ankle   Current Outpatient Prescriptions on File Prior to Visit  Medication Sig Dispense Refill  . Multiple Vitamin (MULTIVITAMIN WITH MINERALS) TABS tablet Take 1 tablet by mouth daily.     No current facility-administered medications on file prior to visit.    Allergies  Allergen Reactions  . Bee Venom Swelling   Family History  Problem Relation Age of Onset  . Diabetes Father    Social History   Social History  . Marital status: Single    Spouse name: N/A  . Number of children: 0  . Years of education: 12+   Occupational History  . [employer]   Social History Main Topics  . Smoking status: Current Some Day Smoker    Packs/day: 0.25    Years: 3.00  . Smokeless tobacco: Never Used  . Alcohol use 18.0 oz/week    30 Standard drinks or equivalent per week     Comment: socially  . Drug use: Yes    Types: Marijuana, Cocaine  . Sexual activity: Yes    Partners: Female    Birth control/ protection: Condom     Comment: inconsistently   Other Topics Concern  . None   Social History Narrative   Lives alone. Family lives in [place].   Depression screen [facility] 2/9  12/01/2016 09/02/2016 04/20/2016 12/30/2015 11/28/2015  Decreased Interest 0 0 0 0 0  Down, Depressed, Hopeless 0 0 0 0 0  PHQ - 2 Score 0 0 0 0 0    Review of Systems See hpi    Objective:   Physical Exam  Constitutional: He is oriented to person, place, and time. He appears well-developed and well-nourished. No distress.  HENT:  Head: Normocephalic and atraumatic.  Eyes: Pupils are equal, round, and reactive to light. Conjunctivae are normal. No scleral icterus.  Neck: Normal range of motion. Neck supple. No thyromegaly present.  Cardiovascular: Normal rate, regular rhythm, normal heart sounds and intact distal pulses.   Pulmonary/Chest: Effort normal and breath sounds normal. No respiratory distress.  Musculoskeletal: He exhibits no edema.  Lymphadenopathy:    He has no cervical adenopathy.  Neurological: He is alert and oriented to person, place, and time.  Skin: Skin is warm and dry. He is not diaphoretic.  Psychiatric: He has a normal mood and affect. His behavior is normal.      BP (!) 150/96 (BP Location: Left Arm, Cuff Size: Large)   Pulse  84   Temp 98.5 F (36.9 C) (Oral)   Resp 16   Ht 6' (1.829 m)   Wt 214 lb (97.1 kg)   SpO2 98%   BMI 29.02 kg/m      Assessment & Plan:   1. Restless leg syndrome, uncontrolled   2. 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 Panic attack      Meds ordered this encounter  Medications  . clonazePAM (KLONOPIN) 0.5 MG tablet    Sig: Take 1 tablet (0.5 mg total) by mouth 2 (two) times daily as needed for anxiety.    Dispense:  60 tablet    Refill:  5  . amphetamine-dextroamphetamine (ADDERALL) 20 MG tablet    Sig: Take 1 tablet (20 mg total) by mouth daily.    Dispense:  30 tablet    Refill:  0    May fill in 30 days from date written  . amphetamine-dextroamphetamine (ADDERALL) 20 MG tablet    Sig: Take 1 tablet (20 mg total) by mouth daily with breakfast.    Dispense:  30 tablet    Refill:   0  . amphetamine-dextroamphetamine (ADDERALL) 20 MG tablet    Sig: Take 1 tablet (20 mg total) by mouth daily.    Dispense:  30 tablet    Refill:  0    May fill 60d from date written  . pramipexole (MIRAPEX) 0.25 MG tablet    Sig: Take 1-2 tablets (0.25-0.5 mg total) by mouth at bedtime as needed (restless legs).    Dispense:  60 tablet    Refill:  3    Norberto Sorenson, M.D.  Primary Care at [facility] [address] [place], [place] [address] [phone] phone [phone] fax  12/03/16 11:32 PM

## 2017-04-15 ENCOUNTER — Encounter: Payer: Self-pay | Admitting: Family Medicine

## 2017-06-02 ENCOUNTER — Ambulatory Visit: Payer: Self-pay | Admitting: Family Medicine

## 2017-06-02 ENCOUNTER — Other Ambulatory Visit: Payer: Self-pay

## 2017-06-02 ENCOUNTER — Encounter: Payer: Self-pay | Admitting: Family Medicine

## 2017-06-02 VITALS — BP 124/82 | HR 82 | Temp 97.5°F | Resp 18 | Ht 72.0 in | Wt 213.6 lb

## 2017-06-02 DIAGNOSIS — T50905A Adverse effect of unspecified drugs, medicaments and biological substances, initial encounter: Secondary | ICD-10-CM

## 2017-06-02 DIAGNOSIS — F419 Anxiety disorder, unspecified: Secondary | ICD-10-CM

## 2017-06-02 DIAGNOSIS — F988 Other specified behavioral and emotional disorders with onset usually occurring in childhood and adolescence: Secondary | ICD-10-CM

## 2017-06-02 DIAGNOSIS — J3089 Other allergic rhinitis: Secondary | ICD-10-CM

## 2017-06-02 DIAGNOSIS — R030 Elevated blood-pressure reading, without diagnosis of hypertension: Secondary | ICD-10-CM

## 2017-06-02 MED ORDER — AZELASTINE HCL 0.1 % NA SOLN: 1.0000 | Freq: Two times a day (BID) | NASAL | 12 refills | Status: DC

## 2017-06-02 MED ORDER — FEXOFENADINE HCL 180 MG PO TABS: 180.0000 mg | ORAL_TABLET | Freq: Every day | ORAL | 3 refills | Status: DC

## 2017-06-02 MED ORDER — MONTELUKAST SODIUM 10 MG PO TABS: 10.0000 mg | ORAL_TABLET | Freq: Every day | ORAL | 3 refills | Status: DC

## 2017-06-02 MED ORDER — AMPHETAMINE-DEXTROAMPHETAMINE 30 MG PO TABS: 15.0000 mg | ORAL_TABLET | Freq: Every day | ORAL | 0 refills | Status: DC

## 2017-06-02 MED ORDER — AMPHETAMINE-DEXTROAMPHETAMINE 30 MG PO TABS: 15.0000 mg | ORAL_TABLET | Freq: Two times a day (BID) | ORAL | 0 refills | Status: DC

## 2017-06-02 MED ORDER — CLONAZEPAM 0.5 MG PO TABS: 0.5000 mg | ORAL_TABLET | Freq: Two times a day (BID) | ORAL | 5 refills | Status: DC | PRN

## 2017-06-02 NOTE — Progress Notes (Signed)
Subjective:  By signing my name below, I, Essence Howell, attest that this documentation has been prepared under the direction and in the presence of Norberto SorensonEva Kerrington Greenhalgh, MD Electronically Signed: Charline BillsEssence Howell, ED Scribe 06/02/2017 at 1:49 PM.   Patient ID: Justin LintJustin Mcguire, male    DOB: 08/18/1985, 32 y.o.   MRN: [id number]  Chief Complaint  Patient presents with  . Medication Refill    Adderall 20 MG, Klonopin 0.5   HPI Justin LintJustin Brenner is a 32 y.o. male who [facility] for medication refill. Last seen 6 months prior. Long standing severe anxiety but poor medication compliance and f/u. He takes prn klonopin 0.5 mg bid but will sometimes still have breakthrough panic attacks. Failed buspar but using klonopin for panic every 2-3 days, works very well for him. Also has long standing h/o ADD. On stimulate therapy, not worse than anxiety but instead helps when he has motivation to do life. Does not have health insurance so has been limited to generic IR products. Filled 5 of 6 klonopin refills before script expired and is due for refill today. Filled adderall 20 mg IR #30 on 10/3, 11/6, 1/3. He reported restless leg symptoms at night which he treated with klonopin. At last visit did trial of mirapex qhs in hopes of not increasing klonopin tolerance in anxiety treatment.  States he has been waking with panic attacks, palpitations but anxiety has not worsened overall. He purchased a BP machine that he purchased from [employer], has been checking his BP at home with systolic readings of 150s-160s. He recently switched to Allegra D for allergy symptoms which has helped. States symptoms seem to be worse in the winter. He has tried Flonase in the past which he states was not helpful. Stopped alcohol for 1 month and did a juice cleanse for 3 days.  ADD He is taking adderall when he wants to be productive. States he tried 15 mg which seemed to work better.  Restless Leg States restless leg doesn't seem to  interfere with his sleep much. Reports that mirapex did not improve symptoms.  Heartburn Pt has tried Nexium without relief. States TUMs and Rolaids are the only treatments that he has tried that actually improve his heartburn.   Current Outpatient Medications on File Prior to Visit  Medication Sig Dispense Refill  . amphetamine-dextroamphetamine (ADDERALL) 20 MG tablet Take 1 tablet (20 mg total) by mouth daily. 30 tablet 0  . amphetamine-dextroamphetamine (ADDERALL) 20 MG tablet Take 1 tablet (20 mg total) by mouth daily with breakfast. 30 tablet 0  . amphetamine-dextroamphetamine (ADDERALL) 20 MG tablet Take 1 tablet (20 mg total) by mouth daily. 30 tablet 0  . clonazePAM (KLONOPIN) 0.5 MG tablet Take 1 tablet (0.5 mg total) by mouth 2 (two) times daily as needed for anxiety. 60 tablet 5  . Multiple Vitamin (MULTIVITAMIN WITH MINERALS) TABS tablet Take 1 tablet by mouth daily.    . pramipexole (MIRAPEX) 0.25 MG tablet Take 1-2 tablets (0.25-0.5 mg total) by mouth at bedtime as needed (restless legs). 60 tablet 3   No current facility-administered medications on file prior to visit.    Past Medical History:  Diagnosis Date  . Allergy   . Anxiety    Past Surgical History:  Procedure Laterality Date  . FRACTURE SURGERY Left 2006   ankle    Allergies  Allergen Reactions  . Bee Venom Swelling   Family History  Problem Relation Age of Onset  . Diabetes Father  Social History   Socioeconomic History  . Marital status: Single    Spouse name: N/A  . Number of children: 0  . Years of education: 12+  . Highest education level: Not on file  Occupational History  . Occupation: Materials engineer: [employer]  Social Needs  . Financial resource strain: Not on file  . Food insecurity:    Worry: Not on file    Inability: Not on file  . Transportation needs:    Medical: Not on file    Non-medical: Not on file  Tobacco Use  . Smoking status: Current Some Day  Smoker    Packs/day: 0.25    Years: 3.00    Pack years: 0.75  . Smokeless tobacco: Never Used  Substance and Sexual Activity  . Alcohol use: Yes    Alcohol/week: 18.0 oz    Types: 30 Standard drinks or equivalent per week    Comment: socially  . Drug use: Yes    Types: Marijuana, Cocaine  . Sexual activity: Yes    Partners: Female    Birth control/protection: Condom    Comment: inconsistently  Lifestyle  . Physical activity:    Days per week: Not on file    Minutes per session: Not on file  . Stress: Not on file  Relationships  . Social connections:    Talks on phone: Not on file    Gets together: Not on file    Attends religious service: Not on file    Active member of club or organization: Not on file    Attends meetings of clubs or organizations: Not on file    Relationship status: Not on file  Other Topics Concern  . Not on file  Social History Narrative   Lives alone. Family lives in [place].   Depression screen [facility] 2/9 06/02/2017 12/01/2016 09/02/2016 04/20/2016 12/30/2015  Decreased Interest 0 0 0 0 0  Down, Depressed, Hopeless 0 0 0 0 0  PHQ - 2 Score 0 0 0 0 0     Review of Systems  Cardiovascular: Positive for palpitations.  Allergic/Immunologic: Positive for environmental allergies.  Psychiatric/Behavioral: Negative for sleep disturbance. The patient is nervous/anxious.       Objective:   Physical Exam  Constitutional: He is oriented to person, place, and time. He appears well-developed and well-nourished. No distress.  HENT:  Head: Normocephalic and atraumatic.  Right Ear: Tympanic membrane and ear canal normal.  Left Ear: Tympanic membrane and ear canal normal.  Nose: Mucosal edema (L greater than R) present.  Mouth/Throat: Oropharynx is clear and moist and mucous membranes are normal.  Eyes: Conjunctivae and EOM are normal.  Neck: Neck supple. No tracheal deviation present.  Cardiovascular: Normal rate.  Pulmonary/Chest: Effort normal. No  respiratory distress.  Musculoskeletal: Normal range of motion.  Neurological: He is alert and oriented to person, place, and time.  Skin: Skin is warm and dry.  Psychiatric: He has a normal mood and affect. His behavior is normal.  Nursing note and vitals reviewed.   BP 124/82 (BP Location: Left Arm, Patient Position: Sitting, Cuff Size: Large)   Pulse 82   Temp (!) 97.5 F (36.4 C) (Oral)   Resp 18   Ht 6' (1.829 m)   Wt 213 lb 9.6 oz (96.9 kg)   SpO2 99%   BMI 28.97 kg/m     Assessment & Plan:   1. Non-seasonal allergic rhinitis, unspecified trigger - D/c oral  decongestants. Antihistamines and nasal steroid not working well so try nasal asteline and singulair in addition to allegra (not -D).  2. Attention deficit disorder (ADD) without hyperactivity - increase adderall from 20mg  IR to 15mg  bid which has worked better for him prior. Sent in 3 rx for 4/4, 5/2, and 5/30. Ok to call in June for 3 additional refills than recheck in 3 mos.  3. Anxiety - pt feels relatively well controlled on klonopin 0.5 bif  4. Elevated blood pressure reading - suspect due to anxiety about BP and daily decongestant use - allegra-D and cough/cold meds. D/c oral decongestants.   5. Medication side effect, initial encounter      Meds ordered this encounter  Medications  . amphetamine-dextroamphetamine (ADDERALL) 30 MG tablet    Sig: Take 0.5 tablets by mouth 2 (two) times daily.    Dispense:  30 tablet    Refill:  0  . DISCONTD: amphetamine-dextroamphetamine (ADDERALL) 30 MG tablet    Sig: Take 0.5 tablets by mouth daily with breakfast.    Dispense:  30 tablet    Refill:  0  . amphetamine-dextroamphetamine (ADDERALL) 30 MG tablet    Sig: Take 0.5 tablets by mouth 2 (two) times daily.    Dispense:  30 tablet    Refill:  0  . clonazePAM (KLONOPIN) 0.5 MG tablet    Sig: Take 1 tablet (0.5 mg total) by mouth 2 (two) times daily as needed for anxiety.    Dispense:  60 tablet    Refill:  5  .  montelukast (SINGULAIR) 10 MG tablet    Sig: Take 1 tablet (10 mg total) by mouth at bedtime.    Dispense:  30 tablet    Refill:  3  . azelastine (ASTELIN) 0.1 % nasal spray    Sig: Place 1 spray into both nostrils 2 (two) times daily.    Dispense:  30 mL    Refill:  12  . fexofenadine (ALLEGRA) 180 MG tablet    Sig: Take 1 tablet (180 mg total) by mouth daily.    Dispense:  90 tablet    Refill:  3  . amphetamine-dextroamphetamine (ADDERALL) 30 MG tablet    Sig: Take 0.5 tablets by mouth 2 (two) times daily.    Dispense:  30 tablet    Refill:  0    D/C prior rx sent on 4/4 to be filled on 5/2 for 1/2 tab qd - wrong sig - supposed to be on 1/2 tab bid.    I personally performed the services described in this documentation, which was scribed in my presence. The recorded information has been reviewed and considered, and addended by me as needed.   Norberto Sorenson, M.D.  Primary Care at [facility] [address] [place], [place] [address] [phone] phone [phone] fax  06/05/17 12:53 PM

## 2017-06-02 NOTE — Patient Instructions (Addendum)
You should stay away from all cough and cold medications containing decongestant, especially phenylephrine and pseudoephedrine (will be listed under the active ingredient list).  To make it easier, Sudafed and any allergy, cough, or cold meds that has a -D at the end will definitely contain an active ingredient that elevates your BP and can cause palpitations. (but -DM is fine for you to use).  If the elevated BP and palpitations continue to occur even after you are off of the -B for several days.  Your blood pressure monitor is very inaccurate and reading 25-30 mmHg higher than is accurate on the top (systolic) reading.  Return the cuff and try to get your $$ refunded.  Start the singular (montelukast) as a booster for your 24 hr non-drowsy anti-histamine like generic zyrtec (cetirizine), claritin (loratadine), or allegra (fexofenadine).  IF you received an x-ray today, you will receive an invoice from [facility]. Please contact [facility] at [phone] with questions or concerns regarding your invoice.   IF you received labwork today, you will receive an invoice from [facility]. Please contact [facility] at [phone] with questions or concerns regarding your invoice.   Our billing staff will not be able to assist you with questions regarding bills from these companies.  You will be contacted with the lab results as soon as they are available. The fastest way to get your results is to activate your My Chart account. Instructions are located on the last page of this paperwork. If you have not heard from us regarding the results in 2 weeks, please contact this office.     DASH Eating Plan DASH stands for "Dietary Approaches to Stop Hypertension." The DASH eating plan is a healthy eating plan that has been shown to reduce high blood pressure (hypertension). It may also reduce your risk for type 2 diabetes, heart disease, and stroke. The DASH eating plan may also help with weight  loss. What are tips for following this plan? General guidelines  Avoid eating more than 2,300 mg (milligrams) of salt (sodium) a day. If you have hypertension, you may need to reduce your sodium intake to 1,500 mg a day.  Limit alcohol intake to no more than 1 drink a day for nonpregnant women and 2 drinks a day for men. One drink equals 12 oz of beer, 5 oz of wine, or 1 oz of hard liquor.  Work with your health care provider to maintain a healthy body weight or to lose weight. Ask what an ideal weight is for you.  Get at least 30 minutes of exercise that causes your heart to beat faster (aerobic exercise) most days of the week. Activities may include walking, swimming, or biking.  Work with your health care provider or diet and nutrition specialist (dietitian) to adjust your eating plan to your individual calorie needs. Reading food labels  Check food labels for the amount of sodium per serving. Choose foods with less than 5 percent of the Daily Value of sodium. Generally, foods with less than 300 mg of sodium per serving fit into this eating plan.  To find whole grains, look for the word "whole" as the first word in the ingredient list. Shopping  Buy products labeled as "low-sodium" or "no salt added."  Buy fresh foods. Avoid canned foods and premade or frozen meals. Cooking  Avoid adding salt when cooking. Use salt-free seasonings or herbs instead of table salt or sea salt. Check with your health care provider or pharmacist before using salt substitutes.  Do not fry foods. Cook foods using healthy methods such as baking, boiling, grilling, and broiling instead.  Cook with heart-healthy oils, such as olive, canola, soybean, or sunflower oil. Meal planning   Eat a balanced diet that includes: ? 5 or more servings of fruits and vegetables each day. At each meal, try to fill half of your plate with fruits and vegetables. ? Up to 6-8 servings of whole grains each day. ? Less than 6  oz of lean meat, poultry, or fish each day. A 3-oz serving of meat is about the same size as a deck of cards. One egg equals 1 oz. ? 2 servings of low-fat dairy each day. ? A serving of nuts, seeds, or beans 5 times each week. ? Heart-healthy fats. Healthy fats called Omega-3 fatty acids are found in foods such as flaxseeds and coldwater fish, like sardines, salmon, and mackerel.  Limit how much you eat of the following: ? Canned or prepackaged foods. ? Food that is high in trans fat, such as fried foods. ? Food that is high in saturated fat, such as fatty meat. ? Sweets, desserts, sugary drinks, and other foods with added sugar. ? Full-fat dairy products.  Do not salt foods before eating.  Try to eat at least 2 vegetarian meals each week.  Eat more home-cooked food and less restaurant, buffet, and fast food.  When eating at a restaurant, ask that your food be prepared with less salt or no salt, if possible. What foods are recommended? The items listed may not be a complete list. Talk with your dietitian about what dietary choices are best for you. Grains Whole-grain or whole-wheat bread. Whole-grain or whole-wheat pasta. Brown rice. Orpah Cobb. Bulgur. Whole-grain and low-sodium cereals. Pita bread. Low-fat, low-sodium crackers. Whole-wheat flour tortillas. Vegetables Fresh or frozen vegetables (raw, steamed, roasted, or grilled). Low-sodium or reduced-sodium tomato and vegetable juice. Low-sodium or reduced-sodium tomato sauce and tomato paste. Low-sodium or reduced-sodium canned vegetables. Fruits All fresh, dried, or frozen fruit. Canned fruit in natural juice (without added sugar). Meat and other protein foods Skinless chicken or [place]. Ground chicken or [place]. Pork with fat trimmed off. Fish and seafood. Egg whites. Dried beans, peas, or lentils. Unsalted nuts, nut butters, and seeds. Unsalted canned beans. Lean cuts of beef with fat trimmed off. Low-sodium, lean deli  meat. Dairy Low-fat (1%) or fat-free (skim) milk. Fat-free, low-fat, or reduced-fat cheeses. Nonfat, low-sodium ricotta or cottage cheese. Low-fat or nonfat yogurt. Low-fat, low-sodium cheese. Fats and oils Soft margarine without trans fats. Vegetable oil. Low-fat, reduced-fat, or light mayonnaise and salad dressings (reduced-sodium). Canola, safflower, olive, soybean, and sunflower oils. Avocado. Seasoning and other foods Herbs. Spices. Seasoning mixes without salt. Unsalted popcorn and pretzels. Fat-free sweets. What foods are not recommended? The items listed may not be a complete list. Talk with your dietitian about what dietary choices are best for you. Grains Baked goods made with fat, such as croissants, muffins, or some breads. Dry pasta or rice meal packs. Vegetables Creamed or fried vegetables. Vegetables in a cheese sauce. Regular canned vegetables (not low-sodium or reduced-sodium). Regular canned tomato sauce and paste (not low-sodium or reduced-sodium). Regular tomato and vegetable juice (not low-sodium or reduced-sodium). Rosita Fire. Olives. Fruits Canned fruit in a light or heavy syrup. Fried fruit. Fruit in cream or butter sauce. Meat and other protein foods Fatty cuts of meat. Ribs. Fried meat. Tomasa Blase. Sausage. Bologna and other processed lunch meats. Salami. Fatback. Hotdogs. Bratwurst. Salted nuts and seeds. Canned beans  with added salt. Canned or smoked fish. Whole eggs or egg yolks. Chicken or [place] with skin. Dairy Whole or 2% milk, cream, and half-and-half. Whole or full-fat cream cheese. Whole-fat or sweetened yogurt. Full-fat cheese. Nondairy creamers. Whipped toppings. Processed cheese and cheese spreads. Fats and oils Butter. Stick margarine. Lard. Shortening. Ghee. Bacon fat. Tropical oils, such as coconut, palm kernel, or palm oil. Seasoning and other foods Salted popcorn and pretzels. Onion salt, garlic salt, seasoned salt, table salt, and sea salt. Worcestershire  sauce. Tartar sauce. Barbecue sauce. Teriyaki sauce. Soy sauce, including reduced-sodium. Steak sauce. Canned and packaged gravies. Fish sauce. Oyster sauce. Cocktail sauce. Horseradish that you find on the shelf. Ketchup. Mustard. Meat flavorings and tenderizers. Bouillon cubes. Hot sauce and Tabasco sauce. Premade or packaged marinades. Premade or packaged taco seasonings. Relishes. Regular salad dressings. Where to find more information:  National Heart, Lung, and Blood Institute: PopSteam.is  American Heart Association: www.heart.org Summary  The DASH eating plan is a healthy eating plan that has been shown to reduce high blood pressure (hypertension). It may also reduce your risk for type 2 diabetes, heart disease, and stroke.  With the DASH eating plan, you should limit salt (sodium) intake to 2,300 mg a day. If you have hypertension, you may need to reduce your sodium intake to 1,500 mg a day.  When on the DASH eating plan, aim to eat more fresh fruits and vegetables, whole grains, lean proteins, low-fat dairy, and heart-healthy fats.  Work with your health care provider or diet and nutrition specialist (dietitian) to adjust your eating plan to your individual calorie needs. This information is not intended to replace advice given to you by your health care provider. Make sure you discuss any questions you have with your health care provider. Document Released: 02/04/2011 Document Revised: 02/09/2016 Document Reviewed: 02/09/2016 Elsevier Interactive Patient Education  [employer].

## 2017-06-05 MED ORDER — AMPHETAMINE-DEXTROAMPHETAMINE 30 MG PO TABS: 15.0000 mg | ORAL_TABLET | Freq: Two times a day (BID) | ORAL | 0 refills | Status: DC

## 2017-06-08 ENCOUNTER — Encounter: Payer: Self-pay | Admitting: Physician Assistant

## 2017-07-01 ENCOUNTER — Ambulatory Visit: Payer: Self-pay | Admitting: Urgent Care

## 2017-08-29 ENCOUNTER — Ambulatory Visit: Payer: Self-pay | Admitting: Family Medicine

## 2017-09-19 ENCOUNTER — Ambulatory Visit: Payer: Self-pay | Admitting: Family Medicine

## 2017-10-10 ENCOUNTER — Ambulatory Visit: Payer: Self-pay

## 2017-10-10 NOTE — Telephone Encounter (Signed)
Patient called in with c/o "abdominal pain." He says "it started about 2-3 days ago to my right lower abdomen and sometimes I feel it in my right testicle. It's a dull ache, about a 1 today, but it has gone as high as a 4. I had a bowel movement yesterday and it was soft, but it did not take the pain away. I wouldn't say it's pain at this point, just a nagging, dull, ache." I asked about any other symptoms, he says "just a little tired." According to protocol, see PCP within 24 hours, no availability with PCP, appointment scheduled for tomorrow at 1140 with [facility], [facility], care advice given, patient verbalized understanding.   Reason for Disposition . [1] MILD pain (e.g., does not interfere with normal activities) AND [2] pain comes and goes (cramps) [3] present > 48 hours  Answer Assessment - Initial Assessment Questions 1. LOCATION: "Where does it hurt?"      Lower right side, dull ache 2. RADIATION: "Does the pain shoot anywhere else?" (e.g., chest, back)     Can feel it in the right testicle, dull ache 3. ONSET: "When did the pain begin?" (Minutes, hours or days ago)      2-3 days ago 4. SUDDEN: "Gradual or sudden onset?"     Gradual 5. PATTERN "Does the pain come and go, or is it constant?"    - If constant: "Is it getting better, staying the same, or worsening?"      (Note: Constant means the pain never goes away completely; most serious pain is constant and it progresses)     - If intermittent: "How long does it last?" "Do you have pain now?"     (Note: Intermittent means the pain goes away completely between bouts)     Come and go 6. SEVERITY: "How bad is the pain?"  (e.g., Scale 1-10; mild, moderate, or severe)    - MILD (1-3): doesn't interfere with normal activities, abdomen soft and not tender to touch     - MODERATE (4-7): interferes with normal activities or awakens from sleep, tender to touch     - SEVERE (8-10): excruciating pain, doubled over, unable to do any normal  activities       Right now a 1, but it has gotten as high as a 4 7. RECURRENT SYMPTOM: "Have you ever had this type of abdominal pain before?" If so, ask: "When was the last time?" and "What happened that time?"      No 8. CAUSE: "What do you think is causing the abdominal pain?"     I don't know 9. RELIEVING/AGGRAVATING FACTORS: "What makes it better or worse?" (e.g., movement, antacids, bowel movement)     LBM last night and it was soft and the pain was still there 10. OTHER SYMPTOMS: "Has there been any vomiting, diarrhea, constipation, or urine problems?"       Just a little tired  Protocols used: ABDOMINAL PAIN - MALE-A-AH

## 2017-10-11 ENCOUNTER — Encounter: Payer: Self-pay | Admitting: Physician Assistant

## 2017-10-11 ENCOUNTER — Ambulatory Visit: Payer: Self-pay | Admitting: Physician Assistant

## 2017-10-11 ENCOUNTER — Other Ambulatory Visit: Payer: Self-pay

## 2017-10-11 VITALS — BP 134/82 | HR 87 | Temp 97.7°F | Resp 18 | Ht 72.0 in | Wt 218.2 lb

## 2017-10-11 DIAGNOSIS — N509 Disorder of male genital organs, unspecified: Secondary | ICD-10-CM

## 2017-10-11 DIAGNOSIS — R109 Unspecified abdominal pain: Secondary | ICD-10-CM

## 2017-10-11 DIAGNOSIS — F419 Anxiety disorder, unspecified: Secondary | ICD-10-CM

## 2017-10-11 DIAGNOSIS — N5089 Other specified disorders of the male genital organs: Secondary | ICD-10-CM

## 2017-10-11 LAB — POCT CBC
Granulocyte percent: 58.4 %G (ref 37–80)
HCT, POC: 47 % (ref 43.5–53.7)
HEMOGLOBIN: 15.3 g/dL (ref 14.1–18.1)
LYMPH, POC: 2.3 (ref 0.6–3.4)
MCH: 29.2 pg (ref 27–31.2)
MCHC: 32.6 g/dL (ref 31.8–35.4)
MCV: 89.4 fL (ref 80–97)
MID (cbc): 0.4 (ref 0–0.9)
MPV: 6.6 fL (ref 0–99.8)
POC GRANULOCYTE: 3.9 (ref 2–6.9)
POC LYMPH PERCENT: 35.3 %L (ref 10–50)
POC MID %: 6.3 %M (ref 0–12)
Platelet Count, POC: 218 10*3/uL (ref 142–424)
RBC: 5.26 M/uL (ref 4.69–6.13)
RDW, POC: 14.3 %
WBC: 6.6 10*3/uL (ref 4.6–10.2)

## 2017-10-11 LAB — POCT URINALYSIS DIP (MANUAL ENTRY)
BILIRUBIN UA: NEGATIVE
GLUCOSE UA: NEGATIVE mg/dL
Ketones, POC UA: NEGATIVE mg/dL
Leukocytes, UA: NEGATIVE
Nitrite, UA: NEGATIVE
RBC UA: NEGATIVE
Urobilinogen, UA: 0.2 E.U./dL
pH, UA: 5.5 (ref 5.0–8.0)

## 2017-10-11 NOTE — Progress Notes (Signed)
Justin Mcguire  MRN: [id number] DOB: Oct 06, 1985  PCP: Sherren MochaShaw, Eva N, MD  Chief Complaint  Patient presents with  . Abdominal Pain    dull pain x1week lower right side     Subjective:  Pt presents to clinic for RLQ pain for the last week.  Dull pain like an ache.  Movement makes it worse.  Sometimes the dull pain radiated into the left testicle.  No F/C.  Maybe mild urinary frequency.  No dysuria.  No change in sexual partner no penile discharge.  When this 1st started he was having mild diarrhea and nausea but now it has resolved.  No surgery on abdomin area.  No sick contacts.  Eating normal.  He was doing okay with the pain from his anxiety level until 2 days ago his dad brought up for possible appendicitis.  Now he is having a lot of anxiety related to this pain.  History is obtained by patient.  Review of Systems  Constitutional: Negative for chills and fever.  Gastrointestinal: Positive for abdominal pain (Right lower quadrant achy sensation). Negative for blood in stool, diarrhea (Resolved now for last 2 days) and nausea.  Genitourinary: Positive for frequency. Negative for dysuria.    Patient Active Problem List   Diagnosis Date Noted  . Sinusitis, acute maxillary 04/20/2016  . Epidermoid cyst of skin of penis 04/20/2016  . BMI 29.0-29.9,adult 05/03/2014  . ADD (attention deficit disorder) 04/07/2012  . Anxiety 04/07/2012    Current Outpatient Medications on File Prior to Visit  Medication Sig Dispense Refill  . amphetamine-dextroamphetamine (ADDERALL) 30 MG tablet Take 0.5 tablets by mouth 2 (two) times daily. 30 tablet 0  . amphetamine-dextroamphetamine (ADDERALL) 30 MG tablet Take 0.5 tablets by mouth 2 (two) times daily. 30 tablet 0  . amphetamine-dextroamphetamine (ADDERALL) 30 MG tablet Take 0.5 tablets by mouth 2 (two) times daily. 30 tablet 0  . azelastine (ASTELIN) 0.1 % nasal spray Place 1 spray into both nostrils 2 (two) times daily. 30 mL 12  . clonazePAM  (KLONOPIN) 0.5 MG tablet Take 1 tablet (0.5 mg total) by mouth 2 (two) times daily as needed for anxiety. 60 tablet 5  . fexofenadine (ALLEGRA) 180 MG tablet Take 1 tablet (180 mg total) by mouth daily. 90 tablet 3  . montelukast (SINGULAIR) 10 MG tablet Take 1 tablet (10 mg total) by mouth at bedtime. 30 tablet 3  . Multiple Vitamin (MULTIVITAMIN WITH MINERALS) TABS tablet Take 1 tablet by mouth daily.     No current facility-administered medications on file prior to visit.     Allergies  Allergen Reactions  . Bee Venom Swelling    Past Medical History:  Diagnosis Date  . Allergy   . Anxiety    Social History   Social History Narrative   Lives alone. Family lives in [place].   Social History   Tobacco Use  . Smoking status: Current Some Day Smoker    Packs/day: 0.25    Years: 3.00    Pack years: 0.75  . Smokeless tobacco: Never Used  Substance Use Topics  . Alcohol use: Yes    Alcohol/week: 30.0 standard drinks    Types: 30 Standard drinks or equivalent per week    Comment: socially  . Drug use: Yes    Types: Marijuana, Cocaine   family history includes Diabetes in his father.     Objective:  BP 134/82   Pulse 87   Temp 97.7 F (36.5 C) (Oral)   Resp  18   Ht 6' (1.829 m)   Wt 218 lb 3.2 oz (99 kg)   SpO2 99%   BMI 29.59 kg/m  Body mass index is 29.59 kg/m.  Wt Readings from Last 3 Encounters:  10/11/17 218 lb 3.2 oz (99 kg)  06/02/17 213 lb 9.6 oz (96.9 kg)  12/01/16 214 lb (97.1 kg)    Physical Exam  Constitutional: He is oriented to person, place, and time. He appears well-developed and well-nourished.  HENT:  Head: Normocephalic and atraumatic.  Right Ear: External ear normal.  Left Ear: External ear normal.  Eyes: Conjunctivae are normal.  Neck: Normal range of motion.  Cardiovascular: Normal rate, regular rhythm and normal heart sounds.  No murmur heard. Pulmonary/Chest: Effort normal and breath sounds normal. He has no wheezes.    Abdominal: Soft. Bowel sounds are normal. There is tenderness (Mild not over McBurney's point ) in the right lower quadrant. Hernia confirmed negative in the right inguinal area and confirmed negative in the left inguinal area.  Genitourinary: Penis normal. Left testis shows mass (Half centimeter circular nodule not attached to the testicle but in left scrotal). Circumcised.  Neurological: He is alert and oriented to person, place, and time.  Skin: Skin is warm and dry.  Psychiatric: His speech is normal and behavior is normal. Judgment and thought content normal. His mood appears anxious.  Vitals reviewed.   Results for orders placed or performed in visit on 10/11/17  POCT urinalysis dipstick  Result Value Ref Range   Color, UA yellow yellow   Clarity, UA clear clear   Glucose, UA negative negative mg/dL   Bilirubin, UA negative negative   Ketones, POC UA negative negative mg/dL   Spec Grav, UA >=1.610 (A) 1.010 - 1.025   Blood, UA negative negative   pH, UA 5.5 5.0 - 8.0   Protein Ur, POC trace (A) negative mg/dL   Urobilinogen, UA 0.2 0.2 or 1.0 E.U./dL   Nitrite, UA Negative Negative   Leukocytes, UA Negative Negative  POCT CBC  Result Value Ref Range   WBC 6.6 4.6 - 10.2 K/uL   Lymph, poc 2.3 0.6 - 3.4   POC LYMPH PERCENT 35.3 10 - 50 %L   MID (cbc) 0.4 0 - 0.9   POC MID % 6.3 0 - 12 %M   POC Granulocyte 3.9 2 - 6.9   Granulocyte percent 58.4 37 - 80 %G   RBC 5.26 4.69 - 6.13 M/uL   Hemoglobin 15.3 14.1 - 18.1 g/dL   HCT, POC 47 96.0 - 45.4 %   MCV 89.4 80 - 97 fL   MCH, POC 29.2 27 - 31.2 pg   MCHC 32.6 31.8 - 35.4 g/dL   RDW, POC 09.8 %   Platelet Count, POC 218 142 - 424 K/uL   MPV 6.6 0 - 99.8 fL     Assessment and Plan :  Abdominal pain, unspecified abdominal location - Plan: POCT urinalysis dipstick, POCT CBC -low suspect of appendicitis especially with normal CBC and relatively benign abdominal exam.  Suspect this is part of a GI illness that is resolving.   No hernia palpated but if this continues consider ultrasound to rule out hernia that was not palpable.  Scrotal mass - Plan: US Scrotum -incidental finding on exam left scrotal nodule will ultrasound to identify  Anxiety-worsened after discovery of scrotal nodule tried to calm patient and was mostly successful we will try to do the ultrasound sooner versus later due to high  anxiety of medical concerns.  Patient verbalized to me that they understand the following: diagnosis, what is being done for them, what to expect and what should be done at home.  Their questions have been answered.  See after visit summary for patient specific instructions.  Benny LennertSarah Brenen Beigel PA-C  Primary Care at [facility] 10/11/2017 12:27 PM  Please note: Portions of this report may have been transcribed using dragon voice recognition software. Every effort was made to ensure accuracy; however, inadvertent computerized transcription errors may be present.

## 2017-10-11 NOTE — Patient Instructions (Addendum)
For left ear pain and pressure- use your astelin to help with likely allergies   We will call you about the US appt that will hopefully be in the next day or so  For your right sided pain --   IF you received an x-ray today, you will receive an invoice from [facility]. Please contact [facility] at [phone] with questions or concerns regarding your invoice.   IF you received labwork today, you will receive an invoice from [facility]. Please contact [facility] at [phone] with questions or concerns regarding your invoice.   Our billing staff will not be able to assist you with questions regarding bills from these companies.  You will be contacted with the lab results as soon as they are available. The fastest way to get your results is to activate your My Chart account. Instructions are located on the last page of this paperwork. If you have not heard from us regarding the results in 2 weeks, please contact this office.

## 2017-10-12 ENCOUNTER — Encounter: Payer: Self-pay | Admitting: Physician Assistant

## 2017-10-25 ENCOUNTER — Other Ambulatory Visit: Payer: Self-pay | Admitting: Family Medicine

## 2017-10-25 DIAGNOSIS — F988 Other specified behavioral and emotional disorders with onset usually occurring in childhood and adolescence: Secondary | ICD-10-CM

## 2017-10-25 NOTE — Telephone Encounter (Signed)
Patient is requesting a refill of the following medications: Requested Prescriptions   Pending Prescriptions Disp Refills  . amphetamine-dextroamphetamine (ADDERALL) 30 MG tablet 30 tablet 0    Sig: Take 0.5 tablets by mouth 2 (two) times daily.  Marland Kitchen. amphetamine-dextroamphetamine (ADDERALL) 30 MG tablet 30 tablet 0    Sig: Take 0.5 tablets by mouth 2 (two) times daily.  Marland Kitchen. amphetamine-dextroamphetamine (ADDERALL) 30 MG tablet 30 tablet 0    Sig: Take 0.5 tablets by mouth 2 (two) times daily.    Date of patient request: 10/25/2017 Last office visit: 10/11/2017 Date of last refill: 07/28/2017 Last refill amount: 30 Follow up time period per chart:

## 2017-11-01 ENCOUNTER — Ambulatory Visit
Admission: RE | Admit: 2017-11-01 | Discharge: 2017-11-01 | Disposition: A | Discharge status: Home / Self Care | Payer: No Typology Code available for payment source | Source: Ambulatory Visit | Attending: Physician Assistant | Admitting: Physician Assistant

## 2017-11-01 DIAGNOSIS — N5089 Other specified disorders of the male genital organs: Secondary | ICD-10-CM

## 2017-11-11 MED ORDER — AMPHETAMINE-DEXTROAMPHETAMINE 30 MG PO TABS: 15.0000 mg | ORAL_TABLET | Freq: Two times a day (BID) | ORAL | 0 refills | Status: DC

## 2017-11-11 NOTE — Telephone Encounter (Signed)
Addressed at visit that BP elevation due to cold meds with decongestant

## 2017-11-11 NOTE — Telephone Encounter (Signed)
Refills sent in. Need OV for any additional  Today I have utilized the [place] Controlled Substance Registry's online query to confirm compliance regarding the patient's controlled medications. My review reveals appropriate prescription fills and that I am the sole provider of these medications. Rechecks will occur regularly and the patient is aware of our use of the system.  Last adderall rxs were written 4/4 and were filled 4/4, 5/10, and 7/2. Clonazepam rxs writtten 4/4 for 0.5 #60 were filled 4/4, 5/10, 7/2, and 8/27 and still have 2 additional refills.

## 2017-12-21 ENCOUNTER — Other Ambulatory Visit: Payer: Self-pay | Admitting: Family Medicine

## 2017-12-21 DIAGNOSIS — F419 Anxiety disorder, unspecified: Secondary | ICD-10-CM

## 2017-12-21 NOTE — Telephone Encounter (Signed)
Requested medication (s) are due for refill today - yes  Requested medication (s) are on the active medication list -yes  Future visit scheduled -yes  Last refill: 06/02/17#60 5 RF  Notes to clinic: Patient has been scheduled for appointment- 02/07/18 with PCP. Rx sent for review- non delegated Rx  Requested Prescriptions  Pending Prescriptions Disp Refills   clonazePAM (KLONOPIN) 0.5 MG tablet [Pharmacy Med Name: clonazePAM Oral Tablet 0.5 MG] 60 tablet 4    Sig: TAKE ONE TABLET BY MOUTH TWICE A DAY AS NEEDED FOR ANXIETY     Not Delegated - Psychiatry:  Anxiolytics/Hypnotics Failed - 12/21/2017  2:48 PM      Failed - This refill cannot be delegated      Failed - Urine Drug Screen completed in last 360 days.      Failed - Valid encounter within last 6 months    Recent Outpatient Visits          2 months ago Abdominal pain, unspecified abdominal location   [facility] at [facility], Dema Severin, PA-C   6 months ago Non-seasonal allergic rhinitis, unspecified trigger   [facility] at [facility], Levell July, MD   1 year ago Restless leg syndrome, uncontrolled   [facility] at [facility], Levell July, MD   1 year ago Bruise   Primary Care at [place], [place], [place]   1 year ago Acute recurrent maxillary sinusitis   Primary Care at [facility], Ilona Sorrel, DO      Future Appointments            In 1 month Clelia Croft Levell July, MD Primary Care at [place], [facility]            Requested Prescriptions  Pending Prescriptions Disp Refills   clonazePAM (KLONOPIN) 0.5 MG tablet [Pharmacy Med Name: clonazePAM Oral Tablet 0.5 MG] 60 tablet 4    Sig: TAKE ONE TABLET BY MOUTH TWICE A DAY AS NEEDED FOR ANXIETY     Not Delegated - Psychiatry:  Anxiolytics/Hypnotics Failed - 12/21/2017  2:48 PM      Failed - This refill cannot be delegated      Failed - Urine Drug Screen completed in last 360 days.      Failed - Valid encounter within last 6 months    Recent Outpatient Visits          2  months ago Abdominal pain, unspecified abdominal location   [facility] at [facility], Dema Severin, PA-C   6 months ago Non-seasonal allergic rhinitis, unspecified trigger   [facility] at [facility], Levell July, MD   1 year ago Restless leg syndrome, uncontrolled   [facility] at [facility], Levell July, MD   1 year ago Bruise   Primary Care at [place], [place], [place]   1 year ago Acute recurrent maxillary sinusitis   Primary Care at [facility], Ilona Sorrel, DO      Future Appointments            In 1 month Clelia Croft Levell July, MD Primary Care at [place], [facility]

## 2017-12-21 NOTE — Telephone Encounter (Signed)
Patient is requesting a refill of the following medications: Requested Prescriptions   Pending Prescriptions Disp Refills  . clonazePAM (KLONOPIN) 0.5 MG tablet [Pharmacy Med Name: clonazePAM Oral Tablet 0.5 MG] 60 tablet 4    Sig: TAKE ONE TABLET BY MOUTH TWICE A DAY AS NEEDED FOR ANXIETY    Date of patient request: 12/21/2017 Last office visit: 10/11/17 saw Weber for abd. pain Date of last refill: 06/02/17 Last refill amount: #60 with 5 refills Follow up time period per chart: n/a  Please advise. Dgaddy, CMA

## 2017-12-23 NOTE — Telephone Encounter (Signed)
Today I have utilized the [place] Controlled Substance Registry's online query to confirm compliance regarding the patient's controlled medications. My review reveals appropriate prescription fills. Rechecks will occur regularly.  Clonazepam 0.5mg  #60 last written on 06/02/17 by myself and was filled 06/02/17, 07/08/17, 7/2, and 8/27 - still had 2 refills on the rx that expired and were never used so did send in a new rx with 1 refill   Has appt sched w/ me for 02/07/18 which he will need to keep for additional refills  Has sufficient adderall rx to get pt to that appt as well.

## 2018-02-01 ENCOUNTER — Ambulatory Visit: Payer: Self-pay | Admitting: Emergency Medicine

## 2018-02-07 ENCOUNTER — Other Ambulatory Visit: Payer: Self-pay

## 2018-02-07 ENCOUNTER — Ambulatory Visit: Payer: Self-pay | Admitting: Family Medicine

## 2018-02-07 ENCOUNTER — Encounter: Payer: Self-pay | Admitting: Family Medicine

## 2018-02-07 ENCOUNTER — Ambulatory Visit: Payer: Self-pay

## 2018-02-07 VITALS — BP 127/78 | HR 76 | Temp 98.2°F | Resp 16 | Ht 71.0 in | Wt 219.6 lb

## 2018-02-07 DIAGNOSIS — F419 Anxiety disorder, unspecified: Secondary | ICD-10-CM

## 2018-02-07 DIAGNOSIS — J0141 Acute recurrent pansinusitis: Secondary | ICD-10-CM

## 2018-02-07 DIAGNOSIS — F988 Other specified behavioral and emotional disorders with onset usually occurring in childhood and adolescence: Secondary | ICD-10-CM

## 2018-02-07 MED ORDER — MONTELUKAST SODIUM 10 MG PO TABS: 10.0000 mg | ORAL_TABLET | Freq: Every day | ORAL | 3 refills | Status: DC

## 2018-02-07 MED ORDER — PREDNISONE 20 MG PO TABS: ORAL_TABLET | ORAL | 0 refills | Status: DC

## 2018-02-07 MED ORDER — AMOXICILLIN-POT CLAVULANATE 875-125 MG PO TABS: 1.0000 | ORAL_TABLET | Freq: Two times a day (BID) | ORAL | 0 refills | Status: DC

## 2018-02-07 MED ORDER — CLONAZEPAM 0.5 MG PO TABS: 0.5000 mg | ORAL_TABLET | Freq: Two times a day (BID) | ORAL | 2 refills | Status: DC | PRN

## 2018-02-07 MED ORDER — AMPHETAMINE-DEXTROAMPHETAMINE 30 MG PO TABS: 15.0000 mg | ORAL_TABLET | Freq: Two times a day (BID) | ORAL | 0 refills | Status: DC

## 2018-02-07 NOTE — Progress Notes (Signed)
Subjective:    Patient: Justin Mcguire  DOB: 1985-12-17; 32 y.o.   MRN: [id number]  Chief Complaint  Patient presents with  . URI    cough, running nose, chest congestion, some fevers last week thought was getting better but not much better  . Medication Refill    need refill on adderall 30 mg,klonopin 0.5 mg, singular 10 mg    HPI Made an appointment to be seen due to severity of cold but then got better so cancelled it but now much worse. Severe sinus pain, sinus HA, eyes popping out of head, horrible bitter taste in mouth.  Taking advil sinus and dayquil and something else for [employer]. Feels fluid in his ear.  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  Got better last week and not as bad as last week but thoat still hurts. Temp up to 100 last wk.  COughing a ton at night and so not sleeping well and wheezing feels tight in chest.  Ran out of singulair which really works so was not needing astelin nasal spray or allegra.  When he wakes up in the middle of the night feels like a bowling ball going down a straw. Occ acid reflux during the day - not heartbutrn - but normally at night. Has GERD every day - tried nexium and several others - none of them work - uses tums when it gets really bad but none of it works for him.   Medical History Past Medical History:  Diagnosis Date  . Allergy   . Anxiety    Past Surgical History:  Procedure Laterality Date  . FRACTURE SURGERY Left 2006   ankle   Current Outpatient Medications on File Prior to Visit  Medication Sig Dispense Refill  . amphetamine-dextroamphetamine (ADDERALL) 30 MG tablet Take 0.5 tablets by mouth 2 (two) times daily. 30 tablet 0  . amphetamine-dextroamphetamine (ADDERALL) 30 MG tablet Take 0.5 tablets by mouth 2 (two) times daily. 30 tablet 0  . amphetamine-dextroamphetamine (ADDERALL) 30 MG tablet Take 0.5 tablets by mouth 2 (two) times daily. 30 tablet 0  . clonazePAM (KLONOPIN) 0.5 MG  tablet Take 1 tablet (0.5 mg total) by mouth 2 (two) times daily as needed for anxiety. **NEED VISIT FOR REFILLS** 60 tablet 1  . montelukast (SINGULAIR) 10 MG tablet Take 1 tablet (10 mg total) by mouth at bedtime. 30 tablet 3  . Multiple Vitamin (MULTIVITAMIN WITH MINERALS) TABS tablet Take 1 tablet by mouth daily.    Marland Kitchen. azelastine (ASTELIN) 0.1 % nasal spray Place 1 spray into both nostrils 2 (two) times daily. (Patient not taking: Reported on 02/07/2018) 30 mL 12  . fexofenadine (ALLEGRA) 180 MG tablet Take 1 tablet (180 mg total) by mouth daily. (Patient not taking: Reported on 02/07/2018) 90 tablet 3   No current facility-administered medications on file prior to visit.    Allergies  Allergen Reactions  . Bee Venom Swelling   Family History  Problem Relation Age of Onset  . Diabetes Father    Social History   Socioeconomic History  . Marital status: Single    Spouse name: N/A  . Number of children: 0  . Years of education: 12+  . Highest education level: Not on file  Occupational History  . Occupation: Materials engineerBar Tender    Employer: [employer]  Social Needs  . Financial resource strain: Not on file  . Food insecurity:  Worry: Not on file    Inability: Not on file  . Transportation needs:    Medical: Not on file    Non-medical: Not on file  Tobacco Use  . Smoking status: Current Some Day Smoker    Packs/day: 0.25    Years: 3.00    Pack years: 0.75  . Smokeless tobacco: Never Used  Substance and Sexual Activity  . Alcohol use: Yes    Alcohol/week: 30.0 standard drinks    Types: 30 Standard drinks or equivalent per week    Comment: socially  . Drug use: Yes    Types: Marijuana, Cocaine  . Sexual activity: Yes    Partners: Female    Birth control/protection: Condom    Comment: inconsistently  Lifestyle  . Physical activity:    Days per week: Not on file    Minutes per session: Not on file  . Stress: Not on file  Relationships  . Social connections:     Talks on phone: Not on file    Gets together: Not on file    Attends religious service: Not on file    Active member of club or organization: Not on file    Attends meetings of clubs or organizations: Not on file    Relationship status: Not on file  Other Topics Concern  . Not on file  Social History Narrative   Lives alone. Family lives in [place].   Depression screen [facility] 2/9 02/07/2018 10/11/2017 06/02/2017 12/01/2016 09/02/2016  Decreased Interest 0 0 0 0 0  Down, Depressed, Hopeless 0 0 0 0 0  PHQ - 2 Score 0 0 0 0 0    ROS As noted in HPI  Objective:  BP 127/78 (BP Location: Right Arm, Patient Position: Sitting, Cuff Size: Large)   Pulse 76   Temp 98.2 F (36.8 C) (Oral)   Resp 16   Ht 5\' 11"  (1.803 m)   Wt 219 lb 9.6 oz (99.6 kg)   SpO2 99%   BMI 30.63 kg/m  Physical Exam Constitutional:      General: He is not in acute distress.    Appearance: He is well-developed. He is not diaphoretic.  HENT:     Head: Normocephalic and atraumatic.     Right Ear: Ear canal and external ear normal. A middle ear effusion is present. Tympanic membrane is retracted.     Left Ear: Ear canal and external ear normal. A middle ear effusion is present. Tympanic membrane is retracted.     Nose: Mucosal edema and rhinorrhea present.     Right Sinus: Maxillary sinus tenderness present.     Left Sinus: Maxillary sinus tenderness present.     Mouth/Throat:     Pharynx: Uvula midline. Posterior oropharyngeal erythema present. No oropharyngeal exudate.  Eyes:     General: No scleral icterus.       Right eye: No discharge.        Left eye: No discharge.     Conjunctiva/sclera: Conjunctivae normal.     Pupils: Pupils are equal, round, and reactive to light.  Neck:     Musculoskeletal: Normal range of motion and neck supple.     Thyroid: No thyromegaly.  Cardiovascular:     Rate and Rhythm: Normal rate and regular rhythm.     Heart sounds: Normal heart sounds.  Pulmonary:     Effort:  Pulmonary effort is normal. No respiratory distress.     Breath sounds: Normal breath sounds.  Lymphadenopathy:  Head:     Right side of head: Submandibular adenopathy present.     Left side of head: Submandibular adenopathy present.     Cervical: No cervical adenopathy.     Upper Body:     Right upper body: No supraclavicular adenopathy.     Left upper body: No supraclavicular adenopathy.  Skin:    General: Skin is warm and dry.     Findings: No erythema.  Neurological:     Mental Status: He is alert and oriented to person, place, and time.  Psychiatric:        Behavior: Behavior normal.     POC TESTING No visits with results within 3 Day(s) from this visit.  Latest known visit with results is:  Office Visit on 10/11/2017  Component Date Value Ref Range Status  . Color, UA 10/11/2017 yellow  yellow Final  . Clarity, UA 10/11/2017 clear  clear Final  . Glucose, UA 10/11/2017 negative  negative mg/dL Final  . Bilirubin, UA 10/11/2017 negative  negative Final  . Ketones, POC UA 10/11/2017 negative  negative mg/dL Final  . Spec Grav, UA 10/11/2017 >=1.030* 1.010 - 1.025 Final  . Blood, UA 10/11/2017 negative  negative Final  . pH, UA 10/11/2017 5.5  5.0 - 8.0 Final  . Protein Ur, POC 10/11/2017 trace* negative mg/dL Final  . Urobilinogen, UA 10/11/2017 0.2  0.2 or 1.0 E.U./dL Final  . Nitrite, UA 96/05/5407 Negative  Negative Final  . Leukocytes, UA 10/11/2017 Negative  Negative Final  . WBC 10/11/2017 6.6  4.6 - 10.2 K/uL Final  . Lymph, poc 10/11/2017 2.3  0.6 - 3.4 Final  . POC LYMPH PERCENT 10/11/2017 35.3  10 - 50 %L Final  . MID (cbc) 10/11/2017 0.4  0 - 0.9 Final  . POC MID % 10/11/2017 6.3  0 - 12 %M Final  . POC Granulocyte 10/11/2017 3.9  2 - 6.9 Final  . Granulocyte percent 10/11/2017 58.4  37 - 80 %G Final  . RBC 10/11/2017 5.26  4.69 - 6.13 M/uL Final  . Hemoglobin 10/11/2017 15.3  14.1 - 18.1 g/dL Final  . HCT, POC 81/19/1478 47  43.5 - 53.7 % Final  . MCV  10/11/2017 89.4  80 - 97 fL Final  . MCH, POC 10/11/2017 29.2  27 - 31.2 pg Final  . MCHC 10/11/2017 32.6  31.8 - 35.4 g/dL Final  . RDW, POC 29/56/2130 14.3  % Final  . Platelet Count, POC 10/11/2017 218  142 - 424 K/uL Final  . MPV 10/11/2017 6.6  0 - 99.8 fL Final     Assessment & Plan:   1. Acute recurrent pansinusitis   2. Attention deficit disorder (ADD) without hyperactivity   3. Anxiety       Patient will continue on current chronic medications other than changes noted above, so ok to refill when needed.   See after visit summary for patient specific instructions.   Meds ordered this encounter  Medications  . montelukast (SINGULAIR) 10 MG tablet    Sig: Take 1 tablet (10 mg total) by mouth at bedtime.    Dispense:  90 tablet    Refill:  3  . amphetamine-dextroamphetamine (ADDERALL) 30 MG tablet    Sig: Take 0.5 tablets by mouth 2 (two) times daily.    Dispense:  30 tablet    Refill:  0  . amphetamine-dextroamphetamine (ADDERALL) 30 MG tablet    Sig: Take 0.5 tablets by mouth 2 (two) times daily.  Dispense:  30 tablet    Refill:  0  . amphetamine-dextroamphetamine (ADDERALL) 30 MG tablet    Sig: Take 0.5 tablets by mouth 2 (two) times daily.    Dispense:  30 tablet    Refill:  0    Please d/c any prior active rx for clonazepam or refills that may on file and start this new rx  . clonazePAM (KLONOPIN) 0.5 MG tablet    Sig: Take 1 tablet (0.5 mg total) by mouth 2 (two) times daily as needed for anxiety.    Dispense:  60 tablet    Refill:  2    Please d/c any prior active rx for clonazepam or refills that may on file and start this new rx  . amoxicillin-clavulanate (AUGMENTIN) 875-125 MG tablet    Sig: Take 1 tablet by mouth 2 (two) times daily.    Dispense:  20 tablet    Refill:  0  . predniSONE (DELTASONE) 20 MG tablet    Sig: 3 tabs po qam x 3d, then 2 tabs po qam x 3d, then 1 tab po qam x 3d    Dispense:  18 tablet    Refill:  0    Patient verbalized  to me that they understand the following: diagnosis, what is being done for them, what to expect and what should be done at home.  Their questions have been answered. They understand that I am unable to predict every possible medication interaction or adverse outcome and that if any unexpected symptoms arise, they should contact us and their pharmacist, as well as never hesitate to seek urgent/emergent care at [facility] or ER if they think it might be warranted.    Norberto Sorenson, MD, MPH Primary Care at [facility] [address] [place], [place]  [address] [phone] Office phone  [phone] Office fax  02/07/18 1:54 PM

## 2018-02-07 NOTE — Telephone Encounter (Signed)
[facility] sent at 02/07/2018 4:06 PM EST   Summary: Medication request   Patient was seen today by Dr. Clelia CroftShaw and she mentioned calling in a cough syrup but it is not on his list of meds for today, would like to know if he can get this called in as well. Please advise, [facility] [id number] - [place], [place] - [address] [phone] (Phone) [phone] (Fax)

## 2018-02-07 NOTE — Patient Instructions (Addendum)
   If you have lab work done today you will be contacted with your lab results within the next 2 weeks.  If you have not heard from us then please contact us. The fastest way to get your results is to register for My Chart.   IF you received an x-ray today, you will receive an invoice from [facility]. Please contact [facility] at [phone] with questions or concerns regarding your invoice.   IF you received labwork today, you will receive an invoice from [facility]. Please contact [facility] at [phone] with questions or concerns regarding your invoice.   Our billing staff will not be able to assist you with questions regarding bills from these companies.  You will be contacted with the lab results as soon as they are available. The fastest way to get your results is to activate your My Chart account. Instructions are located on the last page of this paperwork. If you have not heard from us regarding the results in 2 weeks, please contact this office.     Sinusitis, Adult Sinusitis is soreness and inflammation of your sinuses. Sinuses are hollow spaces in the bones around your face. Your sinuses are located:  Around your eyes.  In the middle of your forehead.  Behind your nose.  In your cheekbones.  Your sinuses and nasal passages are lined with a stringy fluid (mucus). Mucus normally drains out of your sinuses. When your nasal tissues become inflamed or swollen, the mucus can become trapped or blocked so air cannot flow through your sinuses. This allows bacteria, viruses, and funguses to grow, which leads to infection. Sinusitis can develop quickly and last for 7?10 days (acute) or for more than 12 weeks (chronic). Sinusitis often develops after a cold. What are the causes? This condition is caused by anything that creates swelling in the sinuses or stops mucus from draining, including:  Allergies.  Asthma.  Bacterial or viral infection.  Abnormally  shaped bones between the nasal passages.  Nasal growths that contain mucus (nasal polyps).  Narrow sinus openings.  Pollutants, such as chemicals or irritants in the air.  A foreign object stuck in the nose.  A fungal infection. This is rare.  What increases the risk? The following factors may make you more likely to develop this condition:  Having allergies or asthma.  Having had a recent cold or respiratory tract infection.  Having structural deformities or blockages in your nose or sinuses.  Having a weak immune system.  Doing a lot of swimming or diving.  Overusing nasal sprays.  Smoking.  What are the signs or symptoms? The main symptoms of this condition are pain and a feeling of pressure around the affected sinuses. Other symptoms include:  Upper toothache.  Earache.  Headache.  Bad breath.  Decreased sense of smell and taste.  A cough that may get worse at night.  Fatigue.  Fever.  Thick drainage from your nose. The drainage is often green and it may contain pus (purulent).  Stuffy nose or congestion.  Postnasal drip. This is when extra mucus collects in the throat or back of the nose.  Swelling and warmth over the affected sinuses.  Sore throat.  Sensitivity to light.  How is this diagnosed? This condition is diagnosed based on symptoms, a medical history, and a physical exam. To find out if your condition is acute or chronic, your health care provider may:  Look in your nose for signs of nasal polyps.  Tap over   the affected sinus to check for signs of infection.  View the inside of your sinuses using an imaging device that has a light attached (endoscope).  If your health care provider suspects that you have chronic sinusitis, you may also:  Be tested for allergies.  Have a sample of mucus taken from your nose (nasal culture) and checked for bacteria.  Have a mucus sample examined to see if your sinusitis is related to an  allergy.  If your sinusitis does not respond to treatment and it lasts longer than 8 weeks, you may have an MRI or CT scan to check your sinuses. These scans also help to determine how severe your infection is. In rare cases, a bone biopsy may be done to rule out more serious types of fungal sinus disease. How is this treated? Treatment for sinusitis depends on the cause and whether your condition is chronic or acute. If a virus is causing your sinusitis, your symptoms will go away on their own within 10 days. You may be given medicines to relieve your symptoms, including:  Topical nasal decongestants. They shrink swollen nasal passages and let mucus drain from your sinuses.  Antihistamines. These drugs block inflammation that is triggered by allergies. This can help to ease swelling in your nose and sinuses.  Topical nasal corticosteroids. These are nasal sprays that ease inflammation and swelling in your nose and sinuses.  Nasal saline washes. These rinses can help to get rid of thick mucus in your nose.  If your condition is caused by bacteria, you will be given an antibiotic medicine. If your condition is caused by a fungus, you will be given an antifungal medicine. Surgery may be needed to correct underlying conditions, such as narrow nasal passages. Surgery may also be needed to remove polyps. Follow these instructions at home: Medicines  Take, use, or apply over-the-counter and prescription medicines only as told by your health care provider. These may include nasal sprays.  If you were prescribed an antibiotic medicine, take it as told by your health care provider. Do not stop taking the antibiotic even if you start to feel better. Hydrate and Humidify  Drink enough water to keep your urine clear or pale yellow. Staying hydrated will help to thin your mucus.  Use a cool mist humidifier to keep the humidity level in your home above 50%.  Inhale steam for 10-15 minutes, 3-4 times a  day or as told by your health care provider. You can do this in the bathroom while a hot shower is running.  Limit your exposure to cool or dry air. Rest  Rest as much as possible.  Sleep with your head raised (elevated).  Make sure to get enough sleep each night. General instructions  Apply a warm, moist washcloth to your face 3-4 times a day or as told by your health care provider. This will help with discomfort.  Wash your hands often with soap and water to reduce your exposure to viruses and other germs. If soap and water are not available, use hand sanitizer.  Do not smoke. Avoid being around people who are smoking (secondhand smoke).  Keep all follow-up visits as told by your health care provider. This is important. Contact a health care provider if:  You have a fever.  Your symptoms get worse.  Your symptoms do not improve within 10 days. Get help right away if:  You have a severe headache.  You have persistent vomiting.  You have pain or swelling   around your face or eyes.  You have vision problems.  You develop confusion.  Your neck is stiff.  You have trouble breathing. This information is not intended to replace advice given to you by your health care provider. Make sure you discuss any questions you have with your health care provider. Document Released: 02/15/2005 Document Revised: 10/12/2015 Document Reviewed: 12/11/2014 [employer] Interactive Patient Education  2018 [employer]   

## 2018-02-08 NOTE — Telephone Encounter (Signed)
Please see note below and send in meds if appropriate.

## 2018-02-09 ENCOUNTER — Encounter: Payer: Self-pay | Admitting: Family Medicine

## 2018-04-20 ENCOUNTER — Ambulatory Visit: Payer: Self-pay | Admitting: Family Medicine

## 2018-04-20 ENCOUNTER — Other Ambulatory Visit: Payer: Self-pay

## 2018-04-20 ENCOUNTER — Encounter: Payer: Self-pay | Admitting: Family Medicine

## 2018-04-20 VITALS — BP 130/70 | HR 74 | Temp 98.4°F | Ht 70.0 in | Wt 217.2 lb

## 2018-04-20 DIAGNOSIS — R5383 Other fatigue: Secondary | ICD-10-CM

## 2018-04-20 DIAGNOSIS — Z0000 Encounter for general adult medical examination without abnormal findings: Secondary | ICD-10-CM

## 2018-04-20 DIAGNOSIS — M545 Low back pain, unspecified: Secondary | ICD-10-CM

## 2018-04-20 DIAGNOSIS — R6889 Other general symptoms and signs: Secondary | ICD-10-CM

## 2018-04-20 DIAGNOSIS — Z09 Encounter for follow-up examination after completed treatment for conditions other than malignant neoplasm: Secondary | ICD-10-CM

## 2018-04-20 LAB — POCT INFLUENZA A/B
Influenza A, POC: NEGATIVE
Influenza B, POC: NEGATIVE

## 2018-04-20 LAB — POCT URINALYSIS DIP (MANUAL ENTRY)
Blood, UA: NEGATIVE
Glucose, UA: NEGATIVE mg/dL
Ketones, POC UA: NEGATIVE mg/dL
Leukocytes, UA: NEGATIVE
Nitrite, UA: NEGATIVE
Protein Ur, POC: 30 mg/dL — AB
Spec Grav, UA: 1.025 (ref 1.010–1.025)
Urobilinogen, UA: 4 E.U./dL — AB
pH, UA: 6 (ref 5.0–8.0)

## 2018-04-20 MED ORDER — CYCLOBENZAPRINE HCL 10 MG PO TABS: 10.0000 mg | ORAL_TABLET | Freq: Three times a day (TID) | ORAL | 0 refills | Status: DC | PRN

## 2018-04-20 NOTE — Patient Instructions (Addendum)
If you have lab work done today you will be contacted with your lab results within the next 2 weeks.  If you have not heard from us then please contact us. The fastest way to get your results is to register for My Chart.   IF you received an x-ray today, you will receive an invoice from [facility]. Please contact [facility] at [phone] with questions or concerns regarding your invoice.   IF you received labwork today, you will receive an invoice from [facility]. Please contact [facility] at [phone] with questions or concerns regarding your invoice.   Our billing staff will not be able to assist you with questions regarding bills from these companies.  You will be contacted with the lab results as soon as they are available. The fastest way to get your results is to activate your My Chart account. Instructions are located on the last page of this paperwork. If you have not heard from us regarding the results in 2 weeks, please contact this office.    dia Rehydration, Adult Rehydration is the replacement of body fluids and salts and minerals (electrolytes) that are lost during dehydration. Dehydration is when there is not enough fluid or water in the body. This happens when you lose more fluids than you take in. Common causes of dehydration include:  Vomiting.  Diarrhea.  Excessive sweating, such as from heat exposure or exercise.  Taking medicines that cause the body to lose excess fluid (diuretics).  Impaired kidney function.  Not drinking enough fluid.  Certain illnesses or infections.  Certain poorly controlled long-term (chronic) illnesses, such as diabetes, heart disease, and kidney disease.  Symptoms of mild dehydration may include thirst, dry lips and mouth, dry skin, and dizziness. Symptoms of severe dehydration may include increased heart rate, confusion, fainting, and not urinating. You can rehydrate by drinking certain fluids or getting  fluids through an IV tube, as told by your health care provider. What are the risks? Generally, rehydration is safe. However, one problem that can happen is taking in too much fluid (overhydration). This is rare. If overhydration happens, it can cause an electrolyte imbalance, kidney failure, or a decrease in salt (sodium) levels in the body. How to rehydrate Follow instructions from your health care provider for rehydration. The kind of fluid you should drink and the amount you should drink depend on your condition.  If directed by your health care provider, drink an oral rehydration solution (ORS). This is a drink designed to treat dehydration that is found in pharmacies and retail stores. ? Make an ORS by following instructions on the package. ? Start by drinking small amounts, about  cup (120 mL) every 5-10 minutes. ? Slowly increase how much you drink until you have taken the amount recommended by your health care provider.  Drink enough clear fluids to keep your urine clear or pale yellow. If you were instructed to drink an ORS, finish the ORS first, then start slowly drinking other clear fluids. Drink fluids such as: ? Water. Do not drink only water. Doing that can lead to having too little sodium in your body (hyponatremia). ? Ice chips. ? Fruit juice that you have added water to (diluted juice). ? Low-calorie sports drinks.  If you are severely dehydrated, your health care provider may recommend that you receive fluids through an IV tube in the hospital.  Do not take sodium tablets. Doing that can lead to the condition of having too much sodium in  your body (hypernatremia). Eating while you rehydrate Follow instructions from your health care provider about what to eat while you rehydrate. Your health care provider may recommend that you slowly begin eating regular foods in small amounts.  Eat foods that contain a healthy balance of electrolytes, such as bananas, oranges, potatoes,  tomatoes, and spinach.  Avoid foods that are greasy or contain a lot of fat or sugar.  In some cases, you may get nutrition through a feeding tube that is passed through your nose and into your stomach (nasogastric tube, or NG tube). This may be done if you have uncontrolled vomiting or diarrhea. Beverages to avoid Certain beverages may make dehydration worse. While you rehydrate, avoid:  Alcohol.  Caffeine.  Drinks that contain a lot of sugar. These include: ? High-calorie sports drinks. ? Fruit juice that is not diluted. ? Soda.  Check nutrition labels to see how much sugar or caffeine a beverage contains. Signs of dehydration recovery You may be recovering from dehydration if:  You are urinating more often than before you started rehydrating.  Your urine is clear or pale yellow.  Your energy level improves.  You vomit less frequently.  You have diarrhea less frequently.  Your appetite improves or returns to normal.  You feel less dizzy or less light-headed.  Your skin tone and color start to look more normal. Contact a health care provider if:  You continue to have symptoms of mild dehydration, such as: ? Thirst. ? Dry lips. ? Slightly dry mouth. ? Dry, warm skin. ? Dizziness.  You continue to vomit or have diarrhea. Get help right away if:  You have symptoms of dehydration that get worse.  You feel: ? Confused. ? Weak. ? Like you are going to faint.  You have not urinated in 6-8 hours.  You have very dark urine.  You have trouble breathing.  Your heart rate while sitting still is over 100 beats a minute.  You cannot drink fluids without vomiting.  You have vomiting or diarrhea that: ? Gets worse. ? Does not go away.  You have a fever. This information is not intended to replace advice given to you by your health care provider. Make sure you discuss any questions you have with your health care provider. Document Released: 05/10/2011 Document  Revised: 09/05/2015 Document Reviewed: 04/11/2015 Elsevier Interactive Patient Education  2019 Elsevier Inc.  Diarrhea, Adult Diarrhea is when you pass loose and watery poop (stool) often. Diarrhea can make you feel weak and cause you to lose water in your body (get dehydrated). Losing water in your body can cause you to:  Feel tired and thirsty.  Have a dry mouth.  Go pee (urinate) less often. Diarrhea often lasts 2-3 days. However, it can last longer if it is a sign of something more serious. It is important to treat your diarrhea as told by your doctor. Follow these instructions at home: Eating and drinking     Follow these instructions as told by your doctor:  Take an ORS (oral rehydration solution). This is a drink that helps you replace fluids and minerals your body lost. It is sold at pharmacies and stores.  Drink plenty of fluids, such as: ? Water. ? Ice chips. ? Diluted fruit juice. ? Low-calorie sports drinks. ? Milk, if you want.  Avoid drinking fluids that have a lot of sugar or caffeine in them.  Eat bland, easy-to-digest foods in small amounts as you are able. These foods include: ? Bananas. ?  Applesauce. ? Rice. ? Low-fat (lean) meats. ? Toast. ? Crackers.  Avoid alcohol.  Avoid spicy or fatty foods.  Medicines  Take over-the-counter and prescription medicines only as told by your doctor.  If you were prescribed an antibiotic medicine, take it as told by your doctor. Do not stop using the antibiotic even if you start to feel better. General instructions   Wash your hands often using soap and water. If soap and water are not available, use a hand sanitizer. Others in your home should wash their hands as well. Hands should be washed: ? After using the toilet or changing a diaper. ? Before preparing, cooking, or serving food. ? While caring for a sick person. ? While visiting someone in a hospital.  Drink enough fluid to keep your pee (urine) pale  yellow.  Rest at home while you get better.  Watch your condition for any changes.  Take a warm bath to help with any burning or pain from having diarrhea.  Keep all follow-up visits as told by your doctor. This is important. Contact a doctor if:  You have a fever.  Your diarrhea gets worse.  You have new symptoms.  You cannot keep fluids down.  You feel light-headed or dizzy.  You have a headache.  You have muscle cramps. Get help right away if:  You have chest pain.  You feel very weak or you pass out (faint).  You have bloody or black poop or poop that looks like tar.  You have very bad pain, cramping, or bloating in your belly (abdomen).  You have trouble breathing or you are breathing very quickly.  Your heart is beating very quickly.  Your skin feels cold and clammy.  You feel confused.  You have signs of losing too much water in your body, such as: ? Dark pee, very little pee, or no pee. ? Cracked lips. ? Dry mouth. ? Sunken eyes. ? Sleepiness. ? Weakness. Summary  Diarrhea is when you pass loose and watery poop (stool) often.  Diarrhea can make you feel weak and cause you to lose water in your body (get dehydrated).  Take an ORS (oral rehydration solution). This is a drink that is sold at pharmacies and stores.  Eat bland, easy-to-digest foods in small amounts as you are able.  Contact a doctor if your condition gets worse. Get help right away if you have signs that you have lost too much water in your body. This information is not intended to replace advice given to you by your health care provider. Make sure you discuss any questions you have with your health care provider. Document Released: 08/04/2007 Document Revised: 07/22/2017 Document Reviewed: 07/22/2017 Elsevier Interactive Patient Education  2019 Elsevier Inc.    Rehydration, Adult Rehydration is the replacement of body fluids and salts and minerals (electrolytes) that are lost  during dehydration. Dehydration is when there is not enough fluid or water in the body. This happens when you lose more fluids than you take in. Common causes of dehydration include:  Vomiting.  Diarrhea.  Excessive sweating, such as from heat exposure or exercise.  Taking medicines that cause the body to lose excess fluid (diuretics).  Impaired kidney function.  Not drinking enough fluid.  Certain illnesses or infections.  Certain poorly controlled long-term (chronic) illnesses, such as diabetes, heart disease, and kidney disease.  Symptoms of mild dehydration may include thirst, dry lips and mouth, dry skin, and dizziness. Symptoms of severe dehydration may include increased  heart rate, confusion, fainting, and not urinating. You can rehydrate by drinking certain fluids or getting fluids through an IV tube, as told by your health care provider. What are the risks? Generally, rehydration is safe. However, one problem that can happen is taking in too much fluid (overhydration). This is rare. If overhydration happens, it can cause an electrolyte imbalance, kidney failure, or a decrease in salt (sodium) levels in the body. How to rehydrate Follow instructions from your health care provider for rehydration. The kind of fluid you should drink and the amount you should drink depend on your condition.  If directed by your health care provider, drink an oral rehydration solution (ORS). This is a drink designed to treat dehydration that is found in pharmacies and retail stores. ? Make an ORS by following instructions on the package. ? Start by drinking small amounts, about  cup (120 mL) every 5-10 minutes. ? Slowly increase how much you drink until you have taken the amount recommended by your health care provider.  Drink enough clear fluids to keep your urine clear or pale yellow. If you were instructed to drink an ORS, finish the ORS first, then start slowly drinking other clear fluids.  Drink fluids such as: ? Water. Do not drink only water. Doing that can lead to having too little sodium in your body (hyponatremia). ? Ice chips. ? Fruit juice that you have added water to (diluted juice). ? Low-calorie sports drinks.  If you are severely dehydrated, your health care provider may recommend that you receive fluids through an IV tube in the hospital.  Do not take sodium tablets. Doing that can lead to the condition of having too much sodium in your body (hypernatremia). Eating while you rehydrate Follow instructions from your health care provider about what to eat while you rehydrate. Your health care provider may recommend that you slowly begin eating regular foods in small amounts.  Eat foods that contain a healthy balance of electrolytes, such as bananas, oranges, potatoes, tomatoes, and spinach.  Avoid foods that are greasy or contain a lot of fat or sugar.  In some cases, you may get nutrition through a feeding tube that is passed through your nose and into your stomach (nasogastric tube, or NG tube). This may be done if you have uncontrolled vomiting or diarrhea. Beverages to avoid Certain beverages may make dehydration worse. While you rehydrate, avoid:  Alcohol.  Caffeine.  Drinks that contain a lot of sugar. These include: ? High-calorie sports drinks. ? Fruit juice that is not diluted. ? Soda.  Check nutrition labels to see how much sugar or caffeine a beverage contains. Signs of dehydration recovery You may be recovering from dehydration if:  You are urinating more often than before you started rehydrating.  Your urine is clear or pale yellow.  Your energy level improves.  You vomit less frequently.  You have diarrhea less frequently.  Your appetite improves or returns to normal.  You feel less dizzy or less light-headed.  Your skin tone and color start to look more normal. Contact a health care provider if:  You continue to have symptoms of  mild dehydration, such as: ? Thirst. ? Dry lips. ? Slightly dry mouth. ? Dry, warm skin. ? Dizziness.  You continue to vomit or have diarrhea. Get help right away if:  You have symptoms of dehydration that get worse.  You feel: ? Confused. ? Weak. ? Like you are going to faint.  You have not urinated  in 6-8 hours.  You have very dark urine.  You have trouble breathing.  Your heart rate while sitting still is over 100 beats a minute.  You cannot drink fluids without vomiting.  You have vomiting or diarrhea that: ? Gets worse. ? Does not go away.  You have a fever. This information is not intended to replace advice given to you by your health care provider. Make sure you discuss any questions you have with your health care provider. Document Released: 05/10/2011 Document Revised: 09/05/2015 Document Reviewed: 04/11/2015 Elsevier Interactive Patient Education  2019 [employer]. Cyclobenzaprine tablets Qu es este medicamento? La CICLOBENZAPRINA es un relajante muscular. Se utiliza para tratar [employer] y la rigidez de los msculos y espasmos musculares. Este medicamento puede ser utilizado para otros usos; si tiene alguna pregunta consulte con su proveedor de atencin mdica o con su farmacutico. MARCAS COMUNES: Fexmid, Flexeril Qu le debo informar a mi profesional de la salud antes de tomar este medicamento? Necesita saber si usted presenta alguno de los siguientes problemas o situaciones: -enfermedad cardiaca, pulso cardiaco irregular o ataque cardiaco previo -enfermedad heptica -problemas tiroideos -una reaccin alrgica o inusual a la ciclobenzaprina, antidepresivos tricclicos, lactosa, a otros medicamentos, alimentos, colorantes o conservadores -si est embarazada o buscando quedar embarazada -si est amamantando a un beb Cmo debo utilizar este medicamento? Tome este medicamento por va oral con un vaso de agua. Siga las instrucciones de la etiqueta del  [place]. Si este Social worker, tmelo con alimentos o con [employer]. No tome su medicamento con una frecuencia mayor a la indicada. Hable con su pediatra para informarse acerca del uso de este medicamento en nios. Puede requerir atencin especial. Sobredosis: Pngase en contacto inmediatamente con un centro toxicolgico o una sala de urgencia si usted cree que haya tomado demasiado medicamento. ATENCIN: [employer] es solo para usted. No comparta este medicamento con nadie. Qu sucede si me olvido de una dosis? Si olvida una dosis, tmela lo antes posible. Si es casi la hora de su dosis siguiente, tome slo esa dosis. No tome dosis adicionales o dobles. Qu puede interactuar con este medicamento? No tome este medicamento con ninguno de los siguientes frmacos: IMAO, tales como Carbex, Eldepryl, Marplan, Nardil y Parnate Esta medicina tambin puede interactuar con los siguientes medicamentos: alcohol antihistamnicos para Programmer, multimedia, tos y resfro ciertos medicamentos para la ansiedad o para dormir ciertos medicamentos para la depresin, como amitriptilina, fluoxetina, sertralina ciertos medicamentos para convulsiones, tales como fenobarbital, primidona medios de [place] anestsicos locales, tales como [place], pramoxina, Radio producer medicamentos para International aid/development worker los msculos antes de una ciruga medicamentos narcticos para Chief Technology Officer fenotiazinas, tales como Barista, Manufacturing engineer, proclorperazina Puede ser que esta lista no menciona todas las posibles interacciones. Informe a su profesional de [employer] de [employer] productos a base de hierbas, medicamentos de [place] o suplementos nutritivos que est tomando. Si usted fuma, consume bebidas alcohlicas o si utiliza drogas ilegales, indqueselo tambin a su profesional de [employer]. Algunas sustancias pueden interactuar con su medicamento. A qu debo estar atento al usar [employer]? Informe a su mdico o a  su profesional de la salud si sus sntomas no comienzan a mejorar o si empeoran. Puede experimentar somnolencia o mareos. No conduzca, no utilice maquinaria ni haga nada que Scientist, research (life sciences) en estado de alerta hasta que sepa cmo le afecta este medicamento. No se siente ni se ponga de pie con rapidez, especialmente si es un paciente de edad avanzada. Esto reduce  el riesgo de mareos o [place]. El alcohol puede interferir con el efecto de [place]. Evite consumir bebidas alcohlicas. Si est tomando otro medicamento que tambin causa somnolencia, es posible que tenga ms efectos secundarios. Entrguele a su proveedor de atencin mdica una lista de todos los medicamentos que [place]. Su mdico le dir cunto Risk manager. No tome ms medicamento que lo indicado. Llame al servicio de emergencias para recibir ayuda si tiene problemas para respirar o somnolencia inusual. Se le podra secar la boca. Masticar chicle sin azcar, chupar caramelos duros y tomar agua en abundancia lo ayudar a mantener la boca hmeda. Si el problema no desaparece o es severo, consulte a su mdico. Qu efectos secundarios puedo tener al [employer] este medicamento? Efectos secundarios que debe informar a su mdico o a Producer, television/film/video de la salud tan pronto como sea posible: Therapist, art, como erupcin cutnea, comezn/picazn o urticarias, hinchazn de la cara, los labios o la lengua problemas respiratorios dolor en el pecho ritmo cardiaco rpido, irregular alucinaciones convulsiones cansancio o debilidad inusual Efectos secundarios que generalmente no requieren atencin mdica (debe informarlos a su mdico o a su profesional de la salud si persisten o si son molestos): dolor de cabeza nuseas, vmito Puede ser que esta lista no menciona todos los posibles efectos secundarios. Comunquese a su mdico por asesoramiento mdico [employer]. Usted puede informar los efectos secundarios a la FDA por  telfono al 1-800-FDA-1088. Dnde debo guardar mi medicina? Mantngala fuera del alcance de los nios. Gurdela a [employer], entre 15 y 30 grados C (3 y 32 grados F). Mantenga el envase bien cerrado. Deseche todo el medicamento que no haya utilizado, despus de la fecha de vencimiento. ATENCIN: Este folleto es un resumen. Puede ser que no cubra toda la posible informacin. Si usted tiene preguntas acerca de esta medicina, consulte con su mdico, su farmacutico o su profesional de Radiographer, therapeutic.  2019 Elsevier/Gold Standard (2017-03-25 00:00:00)  Muscle Cramps and Spasms Muscle cramps and spasms are when muscles tighten by themselves. They usually get better within minutes. Muscle cramps are painful. They are usually stronger and last longer than muscle spasms. Muscle spasms may or may not be painful. They can last a few seconds or much longer. Cramps and spasms can affect any muscle, but they occur most often in the calf muscles of the leg. They are usually not caused by a serious problem. In many cases, the cause is not known. Some common causes include:  Doing more physical work or exercise than your body is ready for.  Using the muscles too much (overuse) by repeating certain movements too many times.  Staying in a certain position for a long time.  Playing a sport or doing an activity without preparing properly.  Using bad form or technique while playing a sport or doing an activity.  Not having enough water in your body (dehydration).  Injury.  Side effects of some medicines.  Low levels of the salts and minerals in your blood (electrolytes), such as low potassium or calcium. Follow these instructions at home: Managing pain and stiffness      Massage, stretch, and relax the muscle. Do this for many minutes at a time.  If told, put heat on tight or tense muscles as often as told by your doctor. Use the heat source that your doctor recommends, such as a moist heat  pack or a heating pad. ? Place a towel between your skin and the heat source. ?  Leave the heat on for 20-30 minutes. ? Remove the heat if your skin turns bright red. This is very important if you are not able to feel pain, heat, or cold. You may have a greater risk of getting burned.  If told, put ice on the affected area. This may help if you are sore or have pain after a cramp or spasm. ? Put ice in a plastic bag. ? Place a towel between your skin and the bag. ? Leave the ice on for 20 minutes, 2-3 times a day.  Try taking hot showers or baths to help relax tight muscles. Eating and drinking  Drink enough fluid to keep your pee (urine) pale yellow.  Eat a healthy diet to help ensure that your muscles work well. This should include: ? Fruits and vegetables. ? Lean protein. ? Whole grains. ? Low-fat or nonfat dairy products. General instructions  If you are having cramps often, avoid intense exercise for several days.  Take over-the-counter and prescription medicines only as told by your doctor.  Watch for any changes in your symptoms.  Keep all follow-up visits as told by your doctor. This is important. Contact a doctor if:  Your cramps or spasms get worse or happen more often.  Your cramps or spasms do not get better with time. Summary  Muscle cramps and spasms are when muscles tighten by themselves. They usually get better within minutes.  Cramps and spasms occur most often in the calf muscles of the leg.  Massage, stretch, and relax the muscle. This may help the cramp or spasm go away.  Drink enough fluid to keep your pee (urine) pale yellow. This information is not intended to replace advice given to you by your health care provider. Make sure you discuss any questions you have with your health care provider. Document Released: 01/29/2008 Document Revised: 07/11/2017 Document Reviewed: 07/11/2017 Elsevier Interactive Patient Education  2019 [employer].

## 2018-04-20 NOTE — Progress Notes (Signed)
Established Patient--Sick Visit  Subjective:  Patient ID: Justin LintJustin Mcguire, male    DOB: [birth year]  Age: 33 y.o. MRN: [id number]  CC:  Chief Complaint  Patient presents with  . Nausea    X 5 days- pt states diarrhea for 3 days  . Fatigue    X 5 days- pt states with some lower back pain    HPI Justin Mcguire is a 33 year old male who presents for Sick Visit today.   Past Medical History:  Diagnosis Date  . Allergy   . Anxiety    Current Status: Since his last office visit, he has c/o diarrhea, vomiting, nausea, headaches, back pain, and increased fatigue X 5 days. His symptoms have improved, but he continues to have back pain, and fatigue. He has been taking Nyquil and Imodium with no relief. He denies fevers, chills, fatigue, recent infections, weight loss, and night sweats. He has not had any headaches, visual changes, dizziness, and falls. No chest pain, heart palpitations, cough and shortness of breath reported. No reports of GI problems such as nausea, vomiting, diarrhea, and constipation. He has no reports of blood in stools, dysuria and hematuria. No depression or anxiety reported. He denies pain today.   Past Surgical History:  Procedure Laterality Date  . FRACTURE SURGERY Left 2006   ankle    Family History  Problem Relation Age of Onset  . Diabetes Father     Social History   Socioeconomic History  . Marital status: Single    Spouse name: N/A  . Number of children: 0  . Years of education: 12+  . Highest education level: Not on file  Occupational History  . Occupation: Materials engineerBar Tender    Employer: [employer]  Social Needs  . Financial resource strain: Not on file  . Food insecurity:    Worry: Not on file    Inability: Not on file  . Transportation needs:    Medical: Not on file    Non-medical: Not on file  Tobacco Use  . Smoking status: Current Some Day Smoker    Packs/day: 0.25    Years: 3.00    Pack years: 0.75  . Smokeless tobacco: Never Used    Substance and Sexual Activity  . Alcohol use: Yes    Alcohol/week: 30.0 standard drinks    Types: 30 Standard drinks or equivalent per week    Comment: socially  . Drug use: Yes    Types: Marijuana, Cocaine  . Sexual activity: Yes    Partners: Female    Birth control/protection: Condom    Comment: inconsistently  Lifestyle  . Physical activity:    Days per week: Not on file    Minutes per session: Not on file  . Stress: Not on file  Relationships  . Social connections:    Talks on phone: Not on file    Gets together: Not on file    Attends religious service: Not on file    Active member of club or organization: Not on file    Attends meetings of clubs or organizations: Not on file    Relationship status: Not on file  . Intimate partner violence:    Fear of current or ex partner: Not on file    Emotionally abused: Not on file    Physically abused: Not on file    Forced sexual activity: Not on file  Other Topics Concern  . Not on file  Social History Narrative  Lives alone. Family lives in [place].    Outpatient Medications Prior to Visit  Medication Sig Dispense Refill  . amphetamine-dextroamphetamine (ADDERALL) 30 MG tablet Take 0.5 tablets by mouth 2 (two) times daily. 30 tablet 0  . amphetamine-dextroamphetamine (ADDERALL) 30 MG tablet Take 0.5 tablets by mouth 2 (two) times daily. 30 tablet 0  . amphetamine-dextroamphetamine (ADDERALL) 30 MG tablet Take 0.5 tablets by mouth 2 (two) times daily. 30 tablet 0  . clonazePAM (KLONOPIN) 0.5 MG tablet Take 1 tablet (0.5 mg total) by mouth 2 (two) times daily as needed for anxiety. 60 tablet 2  . montelukast (SINGULAIR) 10 MG tablet Take 1 tablet (10 mg total) by mouth at bedtime. 90 tablet 3  . Multiple Vitamin (MULTIVITAMIN WITH MINERALS) TABS tablet Take 1 tablet by mouth daily.    Marland Kitchen amoxicillin-clavulanate (AUGMENTIN) 875-125 MG tablet Take 1 tablet by mouth 2 (two) times daily. (Patient not taking: Reported on  04/20/2018) 20 tablet 0  . predniSONE (DELTASONE) 20 MG tablet 3 tabs po qam x 3d, then 2 tabs po qam x 3d, then 1 tab po qam x 3d (Patient not taking: Reported on 04/20/2018) 18 tablet 0   No facility-administered medications prior to visit.     Allergies  Allergen Reactions  . Bee Venom Swelling   ROS Review of Systems  Constitutional: Positive for fatigue and fever.  HENT: Negative.   Eyes: Negative.   Respiratory: Positive for cough.   Cardiovascular: Negative.   Gastrointestinal: Positive for diarrhea.  Endocrine: Negative.   Genitourinary: Negative.   Musculoskeletal: Negative.   Skin: Negative.   Allergic/Immunologic: Negative.   Neurological: Positive for headaches.  Hematological: Negative.   Psychiatric/Behavioral: Negative.    Objective:    Physical Exam  Constitutional: He is oriented to person, place, and time. He appears well-developed and well-nourished.  HENT:  Head: Normocephalic and atraumatic.  Eyes: Conjunctivae are normal.  Neck: Normal range of motion. Neck supple.  Cardiovascular: Normal rate, regular rhythm, normal heart sounds and intact distal pulses.  Pulmonary/Chest: Effort normal and breath sounds normal.  Abdominal: Soft. Bowel sounds are normal.  Musculoskeletal: Normal range of motion.  Neurological: He is alert and oriented to person, place, and time. He has normal reflexes.  Skin: Skin is warm and dry.  Psychiatric: He has a normal mood and affect. His behavior is normal. Judgment and thought content normal.  Nursing note and vitals reviewed.   BP 130/70 (BP Location: Right Arm, Patient Position: Sitting, Cuff Size: Normal)   Pulse 74   Temp 98.4 F (36.9 C) (Oral)   Ht 5\' 10"  (1.778 m)   Wt 217 lb 3.2 oz (98.5 kg)   BMI 31.16 kg/m  Wt Readings from Last 3 Encounters:  04/20/18 217 lb 3.2 oz (98.5 kg)  02/07/18 219 lb 9.6 oz (99.6 kg)  10/11/17 218 lb 3.2 oz (99 kg)   There are no preventive care reminders to display for this  patient.  There are no preventive care reminders to display for this patient.  No results found for: TSH Lab Results  Component Value Date   WBC 6.6 10/11/2017   HGB 15.3 10/11/2017   HCT 47 10/11/2017   MCV 89.4 10/11/2017   PLT 170 09/02/2016   Lab Results  Component Value Date   NA 141 07/22/2014   K 4.3 07/22/2014   CO2 23 07/22/2014   GLUCOSE 100 (H) 07/22/2014   BUN 16 07/22/2014   CREATININE 1.01 07/22/2014   CALCIUM 9.8  07/22/2014   ANIONGAP 10 07/22/2014   No results found for: CHOL No results found for: HDL No results found for: LDLCALC No results found for: TRIG No results found for: CHOLHDL No results found for: ZOXW9U    Assessment & Plan:   1. Flu-like symptoms Test for Influenza is negative.  - POCT Influenza A/B  2. Acute low back pain without sciatica, unspecified back pain laterality We will initiate Flexeril today.  - cyclobenzaprine (FLEXERIL) 10 MG tablet; Take 1 tablet (10 mg total) by mouth 3 (three) times daily as needed for muscle spasms.  Dispense: 30 tablet; Refill: 0  3. Other fatigue  4. Healthcare maintenance Urinalysis is stable.  - POCT urinalysis dipstick  5. Follow up He will follow up as needed.   Meds ordered this encounter  Medications  . cyclobenzaprine (FLEXERIL) 10 MG tablet    Sig: Take 1 tablet (10 mg total) by mouth 3 (three) times daily as needed for muscle spasms.    Dispense:  30 tablet    Refill:  0   Orders Placed This Encounter  Procedures  . POCT urinalysis dipstick  . POCT Influenza A/B    Referral Orders  No referral(s) requested today   Raliegh Ip,  MSN, FNP-C [facility] at [facility] [address] [place], [place] [address] [phone]  Problem List Items Addressed This Visit    None    Visit Diagnoses    Flu-like symptoms    -  Primary   Acute low back pain without sciatica, unspecified back pain laterality       Relevant Medications   cyclobenzaprine  (FLEXERIL) 10 MG tablet   Other fatigue       Relevant Orders   POCT Influenza A/B   Healthcare maintenance       Relevant Orders   POCT urinalysis dipstick (Completed)   Follow up          Meds ordered this encounter  Medications  . cyclobenzaprine (FLEXERIL) 10 MG tablet    Sig: Take 1 tablet (10 mg total) by mouth 3 (three) times daily as needed for muscle spasms.    Dispense:  30 tablet    Refill:  0    Follow-up: No follow-ups on file.    Kallie Locks, FNP

## 2018-05-30 ENCOUNTER — Other Ambulatory Visit: Payer: Self-pay | Admitting: Family Medicine

## 2018-05-30 DIAGNOSIS — F988 Other specified behavioral and emotional disorders with onset usually occurring in childhood and adolescence: Secondary | ICD-10-CM

## 2018-05-31 NOTE — Telephone Encounter (Signed)
Patient is requesting a refill of the following medications: Requested Prescriptions   Pending Prescriptions Disp Refills  . amphetamine-dextroamphetamine (ADDERALL) 30 MG tablet 30 tablet 0    Sig: Take 0.5 tablets by mouth 2 (two) times daily.  Marland Kitchen amphetamine-dextroamphetamine (ADDERALL) 30 MG tablet 30 tablet 0    Sig: Take 0.5 tablets by mouth 2 (two) times daily.  Marland Kitchen amphetamine-dextroamphetamine (ADDERALL) 30 MG tablet 30 tablet 0    Sig: Take 0.5 tablets by mouth 2 (two) times daily.    Date of patient request: 05/30/2018 Last office visit: 04/20/18 for Flu-like symptoms and on 02/07/2018 for ADD Date of last refill: 02/07/2018, 03/07/2018, and 04/04/2018 Last refill amount: 30 tab  Follow up time period per chart: Return in about 6 months (around 08/09/2018) for f/u med refills.

## 2018-06-01 MED ORDER — AMPHETAMINE-DEXTROAMPHETAMINE 30 MG PO TABS: 15.0000 mg | ORAL_TABLET | Freq: Two times a day (BID) | ORAL | 0 refills | Status: DC

## 2018-06-18 ENCOUNTER — Encounter: Payer: Self-pay | Admitting: Family Medicine

## 2018-06-19 ENCOUNTER — Other Ambulatory Visit: Payer: Self-pay | Admitting: Family Medicine

## 2018-06-19 DIAGNOSIS — F419 Anxiety disorder, unspecified: Secondary | ICD-10-CM

## 2018-07-19 ENCOUNTER — Other Ambulatory Visit: Payer: Self-pay | Admitting: Family Medicine

## 2018-07-21 MED ORDER — AMPHETAMINE-DEXTROAMPHETAMINE 30 MG PO TABS: 15.0000 mg | ORAL_TABLET | Freq: Two times a day (BID) | ORAL | 0 refills | Status: AC

## 2018-08-08 ENCOUNTER — Ambulatory Visit: Payer: Self-pay | Admitting: Family Medicine

## 2018-08-14 ENCOUNTER — Telehealth: Payer: No Typology Code available for payment source | Admitting: Family

## 2018-08-14 ENCOUNTER — Encounter: Payer: Self-pay | Admitting: Family Medicine

## 2018-08-14 DIAGNOSIS — R0602 Shortness of breath: Secondary | ICD-10-CM

## 2018-08-14 DIAGNOSIS — T63484A Toxic effect of venom of other arthropod, undetermined, initial encounter: Secondary | ICD-10-CM

## 2018-08-14 NOTE — Progress Notes (Signed)
Based on what you shared with me, I feel your condition warrants further evaluation and I recommend that you be seen for a face to face office visit.  Since you are having shortness of breath and wheezing after a sting you need to go the ED now.    NOTE: If you entered your credit card information for this eVisit, you will not be charged. You may see a "hold" on your card for the $35 but that hold will drop off and you will not have a charge processed.  If you are having a true medical emergency please call 911.     For an urgent face to face visit, [facility] has five urgent care centers for your convenience:    [email or web address] to reserve your spot online an avoid wait times  [facility] [address], [address] , [place] [address] Modified hours of operation: Monday-Friday, 12 PM to 6 PM  Closed Saturday & Sunday  *Across the street from [facility] [facility] [address] [place], [place] [address] *Just off [address], across the road from [place] hours of operation: Monday-Friday, 12 PM to 6 PM  Closed Saturday & Sunday   The following sites will take your insurance:  . [facility]    [phone]                  Get Driving Directions  [address], [place] [address] . 10 am to 8 pm Monday-Friday . 12 pm to 8 pm Saturday-Sunday   . [facility] at [place]                  Get Driving Directions  [address], [place], [place] [address] . 8 am to 8 pm Monday-Friday . 9 am to 6 pm Saturday . 11 am to 6 pm Sunday   . [facility] at [place]                  Get Driving Directions   [address] [place], [place] [address] . 8 am to 8 pm Monday-Friday . 8 am to 4 pm Saturday-Sunday    . [facility] at [place]                     Get Driving Directions  [phone]  [address] [place], [place] [address]  . Monday-Friday, 12 PM to 6 PM    Your e-visit answers were reviewed by a board certified advanced clinical practitioner to complete your personal care plan.  Thank you for using e-Visits.

## 2018-08-16 ENCOUNTER — Encounter: Payer: Self-pay | Admitting: Family Medicine

## 2018-08-16 ENCOUNTER — Ambulatory Visit: Payer: Self-pay | Admitting: Family Medicine

## 2018-08-16 ENCOUNTER — Other Ambulatory Visit: Payer: Self-pay

## 2018-08-16 VITALS — BP 138/88 | HR 74 | Temp 98.3°F | Resp 16 | Wt 230.7 lb

## 2018-08-16 DIAGNOSIS — R21 Rash and other nonspecific skin eruption: Secondary | ICD-10-CM

## 2018-08-16 DIAGNOSIS — R112 Nausea with vomiting, unspecified: Secondary | ICD-10-CM

## 2018-08-16 DIAGNOSIS — R5383 Other fatigue: Secondary | ICD-10-CM

## 2018-08-16 LAB — POCT CBC
Granulocyte percent: 50.9 %G (ref 37–80)
HCT, POC: 45.9 % — AB (ref 29–41)
Hemoglobin: 16.2 g/dL — AB (ref 11–14.6)
Lymph, poc: 2 (ref 0.6–3.4)
MCH, POC: 30.8 pg (ref 27–31.2)
MCHC: 35.3 g/dL (ref 31.8–35.4)
MCV: 87.2 fL (ref 76–111)
MID (cbc): 0.4 (ref 0–0.9)
MPV: 6.6 fL (ref 0–99.8)
POC Granulocyte: 2.5 (ref 2–6.9)
POC LYMPH PERCENT: 41.4 %L (ref 10–50)
POC MID %: 7.7 %M (ref 0–12)
Platelet Count, POC: 152 10*3/uL (ref 142–424)
RBC: 5.26 M/uL (ref 4.69–6.13)
RDW, POC: 14 %
WBC: 4.9 10*3/uL (ref 4.6–10.2)

## 2018-08-16 MED ORDER — ONDANSETRON 4 MG PO TBDP: 4.0000 mg | ORAL_TABLET | Freq: Three times a day (TID) | ORAL | 0 refills | Status: DC | PRN

## 2018-08-16 NOTE — Patient Instructions (Addendum)
Based on appearance, it is possible that you may have been stung by possible jellyfish or other animal while swimming. Seabathers eruption also possible.   I will check some labs for the previous vomiting, but as the symptoms are improving, unlikely concerning at this time.  Okay to try over-the-counter Claritin for itching on arms, topical hydrocortisone to itching areas or irritated areas, and can even use calamine lotion to the affected areas if those are uncomfortable.  I expect the areas to be improving in the next week to 10 days.  If any worsening symptoms during that time, return here or emergency room.  For nausea, I did write for Zofran if needed for more significant symptoms, but slowly return to normal diet, small sips of fluids frequently if you are feeling nauseous. Avoid alcohol for now.   Return to the clinic or go to the nearest emergency room if any of your symptoms worsen or new symptoms occur.  Recheck in next week if not continuing to improve.    Nausea, Adult Nausea is the feeling that you have an upset stomach or that you are about to vomit. Nausea on its own is not usually a serious concern, but it may be an early sign of a more serious medical problem. As nausea gets worse, it can lead to vomiting. If vomiting develops, or if you are not able to drink enough fluids, you are at risk of becoming dehydrated. Dehydration can make you tired and thirsty, cause you to have a dry mouth, and decrease how often you urinate. Older adults and people with other diseases or a weak disease-fighting system (immune system) are at higher risk for dehydration. The main goals of treating your nausea are:  To relieve your nausea.  To limit repeated nausea episodes.  To prevent vomiting and dehydration. Follow these instructions at home: Watch your symptoms for any changes. Tell your health care provider about them. Follow these instructions as told by your health care provider. Eating and  drinking      Take an oral rehydration solution (ORS). This is a drink that is sold at pharmacies and retail stores.  Drink clear fluids slowly and in small amounts as you are able. Clear fluids include water, ice chips, low-calorie sports drinks, and fruit juice that has water added (diluted fruit juice).  Eat bland, easy-to-digest foods in small amounts as you are able. These foods include bananas, applesauce, rice, lean meats, toast, and crackers.  Avoid drinking fluids that contain a lot of sugar or caffeine, such as energy drinks, sports drinks, and soda.  Avoid alcohol.  Avoid spicy or fatty foods. General instructions  Take over-the-counter and prescription medicines only as told by your health care provider.  Rest at home while you recover.  Drink enough fluid to keep your urine pale yellow.  Breathe slowly and deeply when you feel nauseous.  Avoid smelling things that have strong odors.  Wash your hands often using soap and water. If soap and water are not available, use hand sanitizer.  Make sure that all people in your household wash their hands well and often.  Keep all follow-up visits as told by your health care provider. This is important. Contact a health care provider if:  Your nausea gets worse.  Your nausea does not go away after two days.  You vomit.  You cannot drink fluids without vomiting.  You have any of the following: ? New symptoms. ? A fever. ? A headache. ? Muscle cramps. ?  A rash. ? Pain while urinating.  You feel light-headed or dizzy. Get help right away if:  You have pain in your chest, neck, arm, or jaw.  You feel extremely weak or you faint.  You have vomit that is bright red or looks like coffee grounds.  You have bloody or black stools or stools that look like tar.  You have a severe headache, a stiff neck, or both.  You have severe pain, cramping, or bloating in your abdomen.  You have difficulty breathing or are  breathing very quickly.  Your heart is beating very quickly.  Your skin feels cold and clammy.  You feel confused.  You have signs of dehydration, such as: ? Dark urine, very little urine, or no urine. ? Cracked lips. ? Dry mouth. ? Sunken eyes. ? Sleepiness. ? Weakness. These symptoms may represent a serious problem that is an emergency. Do not wait to see if the symptoms will go away. Get medical help right away. Call your local emergency services (911 in the [place] Do not drive yourself to the hospital. Summary  Nausea is the feeling that you have an upset stomach or that you are about to vomit. Nausea on its own is not usually a serious concern, but it may be an early sign of a more serious medical problem.  If vomiting develops, or if you are not able to drink enough fluids, you are at risk of becoming dehydrated.  Follow recommendations for eating and drinking and take over-the-counter and prescription medicines only as told by your health care provider.  Contact a health care provider right away if your symptoms worsen or you have new symptoms.  Keep all follow-up visits as told by your health care provider. This is important. This information is not intended to replace advice given to you by your health care provider. Make sure you discuss any questions you have with your health care provider. Document Released: 03/25/2004 Document Revised: 07/26/2017 Document Reviewed: 07/26/2017 Elsevier Interactive Patient Education  2019 Elsevier Inc.  Rash, Adult A rash is a change in the color of your skin. A rash can also change the way your skin feels. There are many different conditions and factors that can cause a rash. Some rashes may disappear after a few days, but some may last for a few weeks. Common causes of rashes include:  Viral infections, such as: ? Colds. ? Measles. ? Hand, foot, and mouth disease.  Bacterial infections, such as: ? Scarlet  fever. ? Impetigo.  Fungal infections, such as Candida.  Allergic reactions to food, medicines, or skin care products. Follow these instructions at home: The goal of treatment is to stop the itching and keep the rash from spreading. Pay attention to any changes in your symptoms. Follow these instructions to help with your condition: Medicine Take or apply over-the-counter and prescription medicines only as told by your health care provider. These may include:  Corticosteroid creams to treat red or swollen skin.  Anti-itch lotions.  Oral allergy medicines (antihistamines).  Oral corticosteroids for severe symptoms.  Skin care  Apply cool compresses to the affected areas.  Do not scratch or rub your skin.  Avoid covering the rash. Make sure the rash is exposed to air as much as possible. Managing itching and discomfort  Avoid hot showers or baths, which can make itching worse. A cold shower may help.  Try taking a bath with: ? Epsom salts. Follow manufacturer instructions on the packaging. You can get these  at your local pharmacy or grocery store. ? Baking soda. Pour a small amount into the bath as told by your health care provider. ? Colloidal oatmeal. Follow manufacturer instructions on the packaging. You can get this at your local pharmacy or grocery store.  Try applying baking soda paste to your skin. Stir water into baking soda until it reaches a paste-like consistency.  Try applying calamine lotion. This is an over-the-counter lotion that helps to relieve itchiness.  Keep cool and out of the sun. Sweating and being hot can make itching worse. General instructions   Rest as needed.  Drink enough fluid to keep your urine pale yellow.  Wear loose-fitting clothing.  Avoid scented soaps, detergents, and perfumes. Use gentle soaps, detergents, perfumes, and other cosmetic products.  Avoid any substance that causes your rash. Keep a journal to help track what causes  your rash. Write down: ? What you eat. ? What cosmetic products you use. ? What you drink. ? What you wear. This includes jewelry.  Keep all follow-up visits as told by your health care provider. This is important. Contact a health care provider if:  You sweat at night.  You lose weight.  You urinate more than normal.  You urinate less than normal, or you notice that your urine is a darker color than usual.  You feel weak.  You vomit.  Your skin or the whites of your eyes look yellow (jaundice).  Your skin: ? Tingles. ? Is numb.  Your rash: ? Does not go away after several days. ? Gets worse.  You are: ? Unusually thirsty. ? More tired than normal.  You have: ? New symptoms. ? Pain in your abdomen. ? A fever. ? Diarrhea. Get help right away if you:  Have a fever and your symptoms suddenly get worse.  Develop confusion.  Have a severe headache or a stiff neck.  Have severe joint pains or stiffness.  Have a seizure.  Develop a rash that covers all or most of your body. The rash may or may not be painful.  Develop blisters that: ? Are on top of the rash. ? Grow larger or grow together. ? Are painful. ? Are inside your nose or mouth.  Develop a rash that: ? Looks like purple pinprick-sized spots all over your body. ? Has a "bull's eye" or looks like a target. ? Is not related to sun exposure, is red and painful, and causes your skin to peel. Summary  A rash is a change in the color of your skin. Some rashes disappear after a few days, but some may last for a few weeks.  The goal of treatment is to stop the itching and keep the rash from spreading.  Take or apply over-the-counter and prescription medicines only as told by your health care provider.  Contact a health care provider if you have new or worsening symptoms.  Keep all follow-up visits as told by your health care provider. This is important. This information is not intended to replace  advice given to you by your health care provider. Make sure you discuss any questions you have with your health care provider. Document Released: 02/05/2002 Document Revised: 09/19/2017 Document Reviewed: 09/19/2017 Elsevier Interactive Patient Education  [employer]2019 Elsevier Inc.    If you have lab work done today you will be contacted with your lab results within the next 2 weeks.  If you have not heard from us then please contact us. The fastest way to get your  results is to register for My Chart.   IF you received an x-ray today, you will receive an invoice from [facility]. Please contact [facility] at [phone] with questions or concerns regarding your invoice.   IF you received labwork today, you will receive an invoice from [facility]. Please contact [facility] at [phone] with questions or concerns regarding your invoice.   Our billing staff will not be able to assist you with questions regarding bills from these companies.  You will be contacted with the lab results as soon as they are available. The fastest way to get your results is to activate your My Chart account. Instructions are located on the last page of this paperwork. If you have not heard from us regarding the results in 2 weeks, please contact this office.

## 2018-08-16 NOTE — Progress Notes (Signed)
Subjective:    Patient ID: Justin Mcguire, male    DOB: 20-Nov-1985, 33 y.o.   MRN: [id number]  HPI Justin Mcguire is a 33 y.o. male Presents today for: Chief Complaint  Patient presents with  . Insect Bite    Patient came into the office today presented with bite of some kind last week when he was at the beach. Patient was bitten on the rt flank area of abd and back. Area is painful and red. The day after being bitten patient was vomiting for 10 hrs. Patient is fatigue and still have pain in that area   Rash: Possible bug bites when at beach last week - [place]. Staying in [place].  Occurred about 6 days ago. Woke up and saw area on R low back. - red streaks. Thought was sunburn. Painful and itching. Noticed rash on both arms following day  -itchy. Vomiting started about time noticed rash on arms - later that day.  Vomited multiple times over 10hrs. Now decreased to once per day. Persistent nausea and fatigue. No diarrhea.  No vomiting today.  No mouth/gential rash.  Had been swimming in ocean night before and had a few drinks prior.  Alcohol: 5 drinks night before vomiting. No IDU  Drinking fluids, urinating normally. Less appetite, but tolerating food.   No other individuals on trip were sick.  Slight cough on day 2, but not since.  No new shortness of breath, no change in taste or smell.  No known fever - highest temp 99 yesterday, not able to test at beach.    Tx: otc hydrocortisone on arms, abx ointment on back.   Patient Active Problem List   Diagnosis Date Noted  . Sinusitis, acute maxillary 04/20/2016  . Epidermoid cyst of skin of penis 04/20/2016  . BMI 29.0-29.9,adult 05/03/2014  . ADD (attention deficit disorder) 04/07/2012  . Anxiety 04/07/2012   Past Medical History:  Diagnosis Date  . Allergy   . Anxiety    Past Surgical History:  Procedure Laterality Date  . FRACTURE SURGERY Left 2006   ankle   Allergies  Allergen Reactions  . Bee Venom Swelling   Prior  to Admission medications   Medication Sig Start Date End Date Taking? Authorizing Provider  amphetamine-dextroamphetamine (ADDERALL) 30 MG tablet Take 0.5 tablets by mouth 2 (two) times daily. No further refills without establishing are with new provider 07/21/18  Yes Delia Chimes A, MD  clonazePAM (KLONOPIN) 0.5 MG tablet TAKE ONE TABLET BY MOUTH TWICE A DAY AS NEEDED FOR ANXIETY  06/27/18  Yes Stallings, Zoe A, MD  montelukast (SINGULAIR) 10 MG tablet Take 1 tablet (10 mg total) by mouth at bedtime. 02/07/18  Yes Shawnee Knapp, MD  Multiple Vitamin (MULTIVITAMIN WITH MINERALS) TABS tablet Take 1 tablet by mouth daily.   Yes [provider]   Social History   Socioeconomic History  . Marital status: Single    Spouse name: N/A  . Number of children: 0  . Years of education: 12+  . Highest education level: Not on file  Occupational History  . Occupation: Tax adviser: [employer]  Social Needs  . Financial resource strain: Not on file  . Food insecurity    Worry: Not on file    Inability: Not on file  . Transportation needs    Medical: Not on file    Non-medical: Not on file  Tobacco Use  . Smoking status: Current Some  Day Smoker    Packs/day: 0.25    Years: 3.00    Pack years: 0.75  . Smokeless tobacco: Never Used  Substance and Sexual Activity  . Alcohol use: Yes    Alcohol/week: 30.0 standard drinks    Types: 30 Standard drinks or equivalent per week    Comment: socially  . Drug use: Yes    Types: Marijuana, Cocaine  . Sexual activity: Yes    Partners: Female    Birth control/protection: Condom    Comment: inconsistently  Lifestyle  . Physical activity    Days per week: Not on file    Minutes per session: Not on file  . Stress: Not on file  Relationships  . Social Musicianconnections    Talks on phone: Not on file    Gets together: Not on file    Attends religious service: Not on file    Active member of club or organization: Not on file     Attends meetings of clubs or organizations: Not on file    Relationship status: Not on file  . Intimate partner violence    Fear of current or ex partner: Not on file    Emotionally abused: Not on file    Physically abused: Not on file    Forced sexual activity: Not on file  Other Topics Concern  . Not on file  Social History Narrative   Lives alone. [place] lives in [place].    Review of Systems Per HPI.     Objective:   Physical Exam Vitals signs reviewed.  Constitutional:      General: He is not in acute distress.    Appearance: Normal appearance. He is well-developed. He is not ill-appearing, toxic-appearing or diaphoretic.  HENT:     Head: Normocephalic and atraumatic.  Cardiovascular:     Rate and Rhythm: Normal rate.  Pulmonary:     Effort: Pulmonary effort is normal.  Abdominal:     General: Abdomen is flat.     Tenderness: There is no abdominal tenderness. There is no guarding.  Skin:    Findings: Bruising (healing lower leg bruises (running into bed at home).) and erythema present. No laceration or petechiae.       Neurological:     Mental Status: He is alert and oriented to person, place, and time.    Vitals:   08/16/18 0844 08/16/18 0846  BP: (!) 160/90 138/88  Pulse: 74   Resp: 16   Temp: 98.3 F (36.8 C)   TempSrc: Oral   SpO2: 99%   Weight: 230 lb 11 oz (104.6 kg)    Results for orders placed or performed in visit on 08/16/18  POCT CBC  Result Value Ref Range   WBC 4.9 4.6 - 10.2 K/uL   Lymph, poc 2.0 0.6 - 3.4   POC LYMPH PERCENT 41.4 10 - 50 %L   MID (cbc) 0.4 0 - 0.9   POC MID % 7.7 0 - 12 %M   POC Granulocyte 2.5 2 - 6.9   Granulocyte percent 50.9 37 - 80 %G   RBC 5.26 4.69 - 6.13 M/uL   Hemoglobin 16.2 (A) 11 - 14.6 g/dL   HCT, POC 95.645.9 (A) 29 - 41 %   MCV 87.2 76 - 111 fL   MCH, POC 30.8 27 - 31.2 pg   MCHC 35.3 31.8 - 35.4 g/dL   RDW, POC 21.314.0 %   Platelet Count, POC 152 142 - 424 K/uL   MPV 6.6  0 - 99.8 fL        Assessment & Plan:    Justin Mcguire is a 33 y.o. male Nausea and vomiting, intractability of vomiting not specified, unspecified vomiting type - Plan: Comprehensive metabolic panel, Lipase, POCT CBC, ondansetron (ZOFRAN ODT) 4 MG disintegrating tablet  Fatigue, unspecified type - Plan: POCT CBC  Rash and nonspecific skin eruption - Plan: POCT CBC  Trunk rash noted after swimming in ocean night before. Possible jellyfish sting, vs swimmers eruption vs other contact dermatitis.   - symptomatic care with topical hydrocortisone, calamine, oral claritin as needed. RTC precautions given.   - improving vomiting illness. Reassuring CBC. Check CMP, lipase. Avoid alcohol at this time. zofran if needed, bland foods and fluids for now with RTC precautions.    Meds ordered this encounter  Medications  . ondansetron (ZOFRAN ODT) 4 MG disintegrating tablet    Sig: Take 1 tablet (4 mg total) by mouth every 8 (eight) hours as needed for nausea or vomiting.    Dispense:  10 tablet    Refill:  0   Patient Instructions    Based on appearance, it is possible that you may have been stung by possible jellyfish or other animal while swimming. Seabathers eruption also possible.   I will check some labs for the previous vomiting, but as the symptoms are improving, unlikely concerning at this time.  Okay to try over-the-counter Claritin for itching on arms, topical hydrocortisone to itching areas or irritated areas, and can even use calamine lotion to the affected areas if those are uncomfortable.  I expect the areas to be improving in the next week to 10 days.  If any worsening symptoms during that time, return here or emergency room.  For nausea, I did write for Zofran if needed for more significant symptoms, but slowly return to normal diet, small sips of fluids frequently if you are feeling nauseous. Avoid alcohol for now.   Return to the clinic or go to the nearest emergency room if any of your symptoms  worsen or new symptoms occur.  Recheck in next week if not continuing to improve.    Nausea, Adult Nausea is the feeling that you have an upset stomach or that you are about to vomit. Nausea on its own is not usually a serious concern, but it may be an early sign of a more serious medical problem. As nausea gets worse, it can lead to vomiting. If vomiting develops, or if you are not able to drink enough fluids, you are at risk of becoming dehydrated. Dehydration can make you tired and thirsty, cause you to have a dry mouth, and decrease how often you urinate. Older adults and people with other diseases or a weak disease-fighting system (immune system) are at higher risk for dehydration. The main goals of treating your nausea are:  To relieve your nausea.  To limit repeated nausea episodes.  To prevent vomiting and dehydration. Follow these instructions at home: Watch your symptoms for any changes. Tell your health care provider about them. Follow these instructions as told by your health care provider. Eating and drinking      Take an oral rehydration solution (ORS). This is a drink that is sold at pharmacies and retail stores.  Drink clear fluids slowly and in small amounts as you are able. Clear fluids include water, ice chips, low-calorie sports drinks, and fruit juice that has water added (diluted fruit juice).  Eat bland, easy-to-digest foods in small amounts as  you are able. These foods include bananas, applesauce, rice, lean meats, toast, and crackers.  Avoid drinking fluids that contain a lot of sugar or caffeine, such as energy drinks, sports drinks, and soda.  Avoid alcohol.  Avoid spicy or fatty foods. General instructions  Take over-the-counter and prescription medicines only as told by your health care provider.  Rest at home while you recover.  Drink enough fluid to keep your urine pale yellow.  Breathe slowly and deeply when you feel nauseous.  Avoid smelling  things that have strong odors.  Wash your hands often using soap and water. If soap and water are not available, use hand sanitizer.  Make sure that all people in your household wash their hands well and often.  Keep all follow-up visits as told by your health care provider. This is important. Contact a health care provider if:  Your nausea gets worse.  Your nausea does not go away after two days.  You vomit.  You cannot drink fluids without vomiting.  You have any of the following: ? New symptoms. ? A fever. ? A headache. ? Muscle cramps. ? A rash. ? Pain while urinating.  You feel light-headed or dizzy. Get help right away if:  You have pain in your chest, neck, arm, or jaw.  You feel extremely weak or you faint.  You have vomit that is bright red or looks like coffee grounds.  You have bloody or black stools or stools that look like tar.  You have a severe headache, a stiff neck, or both.  You have severe pain, cramping, or bloating in your abdomen.  You have difficulty breathing or are breathing very quickly.  Your heart is beating very quickly.  Your skin feels cold and clammy.  You feel confused.  You have signs of dehydration, such as: ? Dark urine, very little urine, or no urine. ? Cracked lips. ? Dry mouth. ? Sunken eyes. ? Sleepiness. ? Weakness. These symptoms may represent a serious problem that is an emergency. Do not wait to see if the symptoms will go away. Get medical help right away. Call your local emergency services (911 in the [place] Do not drive yourself to the hospital. Summary  Nausea is the feeling that you have an upset stomach or that you are about to vomit. Nausea on its own is not usually a serious concern, but it may be an early sign of a more serious medical problem.  If vomiting develops, or if you are not able to drink enough fluids, you are at risk of becoming dehydrated.  Follow recommendations for eating and drinking and  take over-the-counter and prescription medicines only as told by your health care provider.  Contact a health care provider right away if your symptoms worsen or you have new symptoms.  Keep all follow-up visits as told by your health care provider. This is important. This information is not intended to replace advice given to you by your health care provider. Make sure you discuss any questions you have with your health care provider. Document Released: 03/25/2004 Document Revised: 07/26/2017 Document Reviewed: 07/26/2017 Elsevier Interactive Patient Education  2019 Elsevier Inc.  Rash, Adult A rash is a change in the color of your skin. A rash can also change the way your skin feels. There are many different conditions and factors that can cause a rash. Some rashes may disappear after a few days, but some may last for a few weeks. Common causes of rashes include:  Viral infections, such as: ? Colds. ? Measles. ? Hand, foot, and mouth disease.  Bacterial infections, such as: ? Scarlet fever. ? Impetigo.  Fungal infections, such as Candida.  Allergic reactions to food, medicines, or skin care products. Follow these instructions at home: The goal of treatment is to stop the itching and keep the rash from spreading. Pay attention to any changes in your symptoms. Follow these instructions to help with your condition: Medicine Take or apply over-the-counter and prescription medicines only as told by your health care provider. These may include:  Corticosteroid creams to treat red or swollen skin.  Anti-itch lotions.  Oral allergy medicines (antihistamines).  Oral corticosteroids for severe symptoms.  Skin care  Apply cool compresses to the affected areas.  Do not scratch or rub your skin.  Avoid covering the rash. Make sure the rash is exposed to air as much as possible. Managing itching and discomfort  Avoid hot showers or baths, which can make itching worse. A cold shower  may help.  Try taking a bath with: ? Epsom salts. Follow manufacturer instructions on the packaging. You can get these at your local pharmacy or grocery store. ? Baking soda. Pour a small amount into the bath as told by your health care provider. ? Colloidal oatmeal. Follow manufacturer instructions on the packaging. You can get this at your local pharmacy or grocery store.  Try applying baking soda paste to your skin. Stir water into baking soda until it reaches a paste-like consistency.  Try applying calamine lotion. This is an over-the-counter lotion that helps to relieve itchiness.  Keep cool and out of the sun. Sweating and being hot can make itching worse. General instructions   Rest as needed.  Drink enough fluid to keep your urine pale yellow.  Wear loose-fitting clothing.  Avoid scented soaps, detergents, and perfumes. Use gentle soaps, detergents, perfumes, and other cosmetic products.  Avoid any substance that causes your rash. Keep a journal to help track what causes your rash. Write down: ? What you eat. ? What cosmetic products you use. ? What you drink. ? What you wear. This includes jewelry.  Keep all follow-up visits as told by your health care provider. This is important. Contact a health care provider if:  You sweat at night.  You lose weight.  You urinate more than normal.  You urinate less than normal, or you notice that your urine is a darker color than usual.  You feel weak.  You vomit.  Your skin or the whites of your eyes look yellow (jaundice).  Your skin: ? Tingles. ? Is numb.  Your rash: ? Does not go away after several days. ? Gets worse.  You are: ? Unusually thirsty. ? More tired than normal.  You have: ? New symptoms. ? Pain in your abdomen. ? A fever. ? Diarrhea. Get help right away if you:  Have a fever and your symptoms suddenly get worse.  Develop confusion.  Have a severe headache or a stiff neck.  Have severe  joint pains or stiffness.  Have a seizure.  Develop a rash that covers all or most of your body. The rash may or may not be painful.  Develop blisters that: ? Are on top of the rash. ? Grow larger or grow together. ? Are painful. ? Are inside your nose or mouth.  Develop a rash that: ? Looks like purple pinprick-sized spots all over your body. ? Has a "bull's eye" or looks like a  target. ? Is not related to sun exposure, is red and painful, and causes your skin to peel. Summary  A rash is a change in the color of your skin. Some rashes disappear after a few days, but some may last for a few weeks.  The goal of treatment is to stop the itching and keep the rash from spreading.  Take or apply over-the-counter and prescription medicines only as told by your health care provider.  Contact a health care provider if you have new or worsening symptoms.  Keep all follow-up visits as told by your health care provider. This is important. This information is not intended to replace advice given to you by your health care provider. Make sure you discuss any questions you have with your health care provider. Document Released: 02/05/2002 Document Revised: 09/19/2017 Document Reviewed: 09/19/2017 Elsevier Interactive Patient Education  [employer]2019 Elsevier Inc.    If you have lab work done today you will be contacted with your lab results within the next 2 weeks.  If you have not heard from us then please contact us. The fastest way to get your results is to register for My Chart.   IF you received an x-ray today, you will receive an invoice from [facility]. Please contact [facility] at [phone] with questions or concerns regarding your invoice.   IF you received labwork today, you will receive an invoice from [facility]. Please contact [facility] at [phone] with questions or concerns regarding your invoice.   Our billing staff will not be able to assist you with  questions regarding bills from these companies.  You will be contacted with the lab results as soon as they are available. The fastest way to get your results is to activate your My Chart account. Instructions are located on the last page of this paperwork. If you have not heard from us regarding the results in 2 weeks, please contact this office.       Signed,   Meredith StaggersJeffrey Gabriela Irigoyen, MD Primary [facility] at [facility].  08/16/18 11:07 PM

## 2018-08-17 LAB — COMPREHENSIVE METABOLIC PANEL
ALT: 70 IU/L — ABNORMAL HIGH (ref 0–44)
AST: 54 IU/L — ABNORMAL HIGH (ref 0–40)
Albumin/Globulin Ratio: 2.2 (ref 1.2–2.2)
Albumin: 4.7 g/dL (ref 4.0–5.0)
Alkaline Phosphatase: 73 IU/L (ref 39–117)
BUN/Creatinine Ratio: 14 (ref 9–20)
BUN: 14 mg/dL (ref 6–20)
Bilirubin Total: 0.5 mg/dL (ref 0.0–1.2)
CO2: 20 mmol/L (ref 20–29)
Calcium: 9.1 mg/dL (ref 8.7–10.2)
Chloride: 104 mmol/L (ref 96–106)
Creatinine, Ser: 0.97 mg/dL (ref 0.76–1.27)
GFR calc Af Amer: 117 mL/min/{1.73_m2} (ref >59)
GFR calc non Af Amer: 101 mL/min/{1.73_m2} (ref >59)
Globulin, Total: 2.1 g/dL (ref 1.5–4.5)
Glucose: 111 mg/dL — ABNORMAL HIGH (ref 65–99)
Potassium: 4 mmol/L (ref 3.5–5.2)
Sodium: 141 mmol/L (ref 134–144)
Total Protein: 6.8 g/dL (ref 6.0–8.5)

## 2018-08-17 LAB — LIPASE: Lipase: 59 U/L (ref 13–78)

## 2018-08-30 ENCOUNTER — Other Ambulatory Visit: Payer: Self-pay | Admitting: Family Medicine

## 2018-08-30 DIAGNOSIS — R739 Hyperglycemia, unspecified: Secondary | ICD-10-CM

## 2018-08-30 DIAGNOSIS — R7989 Other specified abnormal findings of blood chemistry: Secondary | ICD-10-CM

## 2019-09-11 ENCOUNTER — Ambulatory Visit
Admission: RE | Admit: 2019-09-11 | Discharge: 2019-09-11 | Disposition: A | Discharge status: Home / Self Care | Payer: BC Managed Care – PPO | Source: Ambulatory Visit | Attending: Emergency Medicine | Admitting: Emergency Medicine

## 2019-09-11 ENCOUNTER — Other Ambulatory Visit: Payer: Self-pay

## 2019-09-11 VITALS — BP 139/91 | HR 75 | Temp 98.0°F | Resp 16

## 2019-09-11 DIAGNOSIS — M549 Dorsalgia, unspecified: Secondary | ICD-10-CM | POA: Diagnosis not present

## 2019-09-11 MED ORDER — KETOROLAC TROMETHAMINE 30 MG/ML IJ SOLN
30.0000 mg | Freq: Once | INTRAMUSCULAR | Status: AC
  Administered 2019-09-11: 30 mg via INTRAMUSCULAR

## 2019-09-11 MED ORDER — NAPROXEN 500 MG PO TABS
500.0000 mg | ORAL_TABLET | Freq: Two times a day (BID) | ORAL | 0 refills | Status: AC
Start: 2019-09-11 — End: ?

## 2019-09-11 NOTE — Discharge Instructions (Addendum)
Go to ER for worsening pain, blood in urine, fever, vomiting.

## 2019-09-11 NOTE — ED Triage Notes (Signed)
Pt presents to [facility] for assessment of bilateral back pain starting yesterday, states he is now having RLQ pain starting a few hours ago.  C/o fatigue, denies fevers.  Pt states 1 episode of emesis last night. Pt denies changes to urination.

## 2019-09-11 NOTE — ED Provider Notes (Signed)
[facility]    CSN: [id number] Arrival date & time: 09/11/19  1453      History   Chief Complaint Chief Complaint  Patient presents with  . Back Pain    HPI Justin Mcguire is a 34 y.o. male with history of asthma, anxiety presenting for bilateral back pain since yesterday.  Patient states it started out of the blue.  No inciting event, trauma, accident.  Denies change in activity level.  States he vomited last night without blood or bile.  No nausea or vomiting today.  Denies chest pain, shortness of breath, fevers, rash.  No upper or lower extremity numbness or weakness, saddle area anesthesia, urinary retention, fecal incontinence.  States his father has a history of kidney stones, though he does not personally have history thereof.  No dysuria, penile or testicular pain, hematuria.   Past Medical History:  Diagnosis Date  . Allergy   . Anxiety     Patient Active Problem List   Diagnosis Date Noted  . Sinusitis, acute maxillary 04/20/2016  . Epidermoid cyst of skin of penis 04/20/2016  . BMI 29.0-29.9,adult 05/03/2014  . ADD (attention deficit disorder) 04/07/2012  . Anxiety 04/07/2012    Past Surgical History:  Procedure Laterality Date  . FRACTURE SURGERY Left 2006   ankle       Home Medications    Prior to Admission medications   Medication Sig Start Date End Date Taking? Authorizing Provider  amphetamine-dextroamphetamine (ADDERALL) 30 MG tablet Take 0.5 tablets by mouth 2 (two) times daily. No further refills without establishing are with new provider 07/21/18   Doristine Bosworth, MD  clonazePAM (KLONOPIN) 0.5 MG tablet TAKE ONE TABLET BY MOUTH TWICE A DAY AS NEEDED FOR ANXIETY  06/27/18   Doristine Bosworth, MD  Multiple Vitamin (MULTIVITAMIN WITH MINERALS) TABS tablet Take 1 tablet by mouth daily.    [provider]  naproxen (NAPROSYN) 500 MG tablet Take 1 tablet (500 mg total) by mouth 2 (two) times daily. 09/11/19   Hall-Potvin, [place],  PA-C  montelukast (SINGULAIR) 10 MG tablet Take 1 tablet (10 mg total) by mouth at bedtime. 02/07/18 09/11/19  Sherren Mocha, MD    Family History Family History  Problem Relation Age of Onset  . Diabetes Father     Social History Social History   Tobacco Use  . Smoking status: Former Smoker    Packs/day: 0.25    Years: 3.00    Pack years: 0.75  . Smokeless tobacco: Never Used  Vaping Use  . Vaping Use: Never used  Substance Use Topics  . Alcohol use: Yes    Alcohol/week: 30.0 standard drinks    Types: 30 Standard drinks or equivalent per week    Comment: socially  . Drug use: Yes    Types: Marijuana, Cocaine     Allergies   Bee venom   Review of Systems As per HPI   Physical Exam Triage Vital Signs ED Triage Vitals  Enc Vitals Group     BP      Pulse      Resp      Temp      Temp src      SpO2      Weight      Height      Head Circumference      Peak Flow      Pain Score      Pain Loc      Pain Edu?  Excl. in GC?    No data found.  Updated Vital Signs BP (!) 139/91 (BP Location: Left Arm)   Pulse 75   Temp 98 F (36.7 C) (Oral)   Resp 16   SpO2 97%   Visual Acuity Right Eye Distance:   Left Eye Distance:   Bilateral Distance:    Right Eye Near:   Left Eye Near:    Bilateral Near:     Physical Exam Constitutional:      General: He is not in acute distress. HENT:     Head: Normocephalic and atraumatic.  Eyes:     General: No scleral icterus.    Pupils: Pupils are equal, round, and reactive to light.  Cardiovascular:     Rate and Rhythm: Normal rate and regular rhythm.  Pulmonary:     Effort: Pulmonary effort is normal. No respiratory distress.     Breath sounds: No wheezing or rales.  Abdominal:     General: Bowel sounds are normal.     Palpations: Abdomen is soft.     Tenderness: There is no abdominal tenderness. There is no right CVA tenderness or left CVA tenderness.  Musculoskeletal:        General: No swelling,  tenderness or deformity. Normal range of motion.     Cervical back: Neck supple. No tenderness.     Right lower leg: No edema.     Left lower leg: No edema.     Comments: C, T, L spines unremarkable  Lymphadenopathy:     Cervical: No cervical adenopathy.  Skin:    Capillary Refill: Capillary refill takes less than 2 seconds.     Coloration: Skin is not jaundiced or pale.     Findings: No rash.  Neurological:     General: No focal deficit present.     Mental Status: He is alert and oriented to person, place, and time.      UC Treatments / Results  Labs (all labs ordered are listed, but only abnormal results are displayed) Labs Reviewed - No data to display  EKG   Radiology No results found.  Procedures Procedures (including critical care time)  Medications Ordered in UC Medications  ketorolac (TORADOL) 30 MG/ML injection 30 mg (has no administration in time range)    Initial Impression / Assessment and Plan / UC Course  I have reviewed the triage vital signs and the nursing notes.  Pertinent labs & imaging results that were available during my care of the patient were reviewed by me and considered in my medical decision making (see chart for details).     Patient febrile, nontoxic in office today.  Patient unable to articulate specifically where his back hurts.  Hemodynamically stable and without chest pain.  Given family history of renal calculi, this could be contributory.  Given Toradol shot, strainer.  Return precautions discussed, patient verbalized understanding and is agreeable to plan. Final Clinical Impressions(s) / UC Diagnoses   Final diagnoses:  Bilateral back pain, unspecified back location, unspecified chronicity   Discharge Instructions   None    ED Prescriptions    Medication Sig Dispense Auth. Provider   naproxen (NAPROSYN) 500 MG tablet Take 1 tablet (500 mg total) by mouth 2 (two) times daily. 30 tablet Hall-Potvin, [place], PA-C     I  have reviewed the PDMP during this encounter.   Hall-Potvin, [place], [place] 09/11/19 1533

## 2019-12-02 IMAGING — US US SCROTUM W/ DOPPLER COMPLETE
1 series · 14 of 25 positions shown · non-contrast
Comparison: None.

CLINICAL DATA: Initial evaluation for left scrotal mass, palpated
on physical exam.

EXAM:
SCROTAL ULTRASOUND
DOPPLER ULTRASOUND OF THE TESTICLES
TECHNIQUE: Complete ultrasound examination of the testicles, epididymis, and
other scrotal structures was performed. Color and spectral Doppler
ultrasound were also utilized to evaluate blood flow to the
testicles.

[Series 1: us scrotum w/ doppler complete · 0.06mm/px · 14 of 66 slices shown]
[im 1/66]
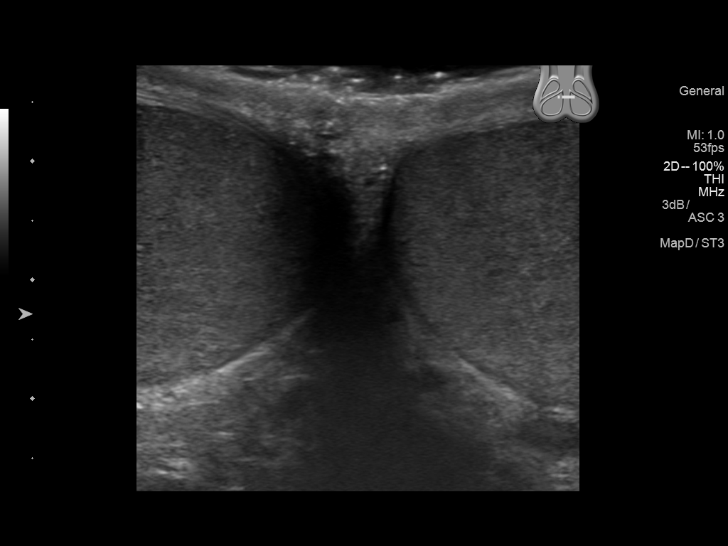
[im 6/66]
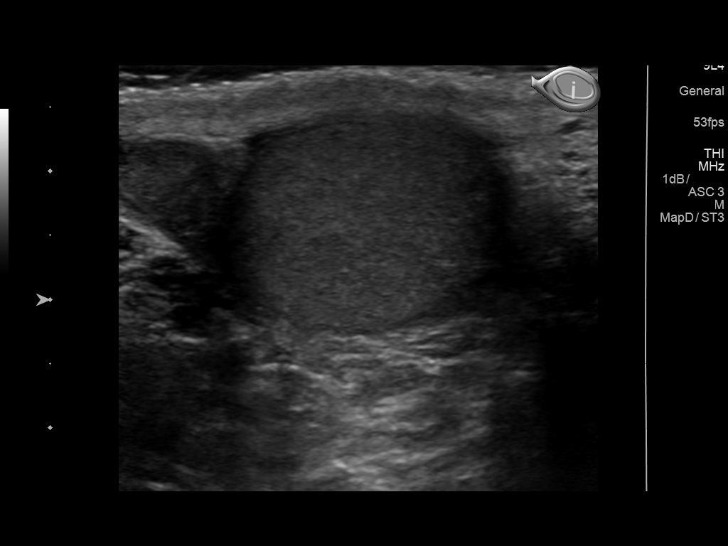
[im 11/66]
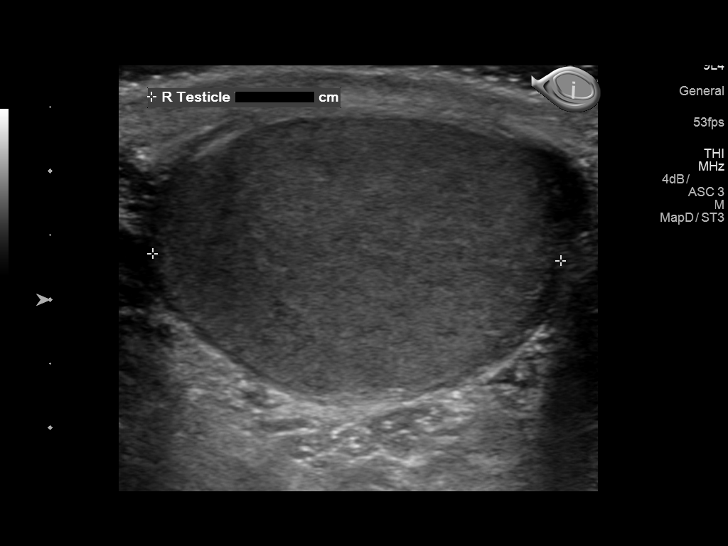
[im 17/66]
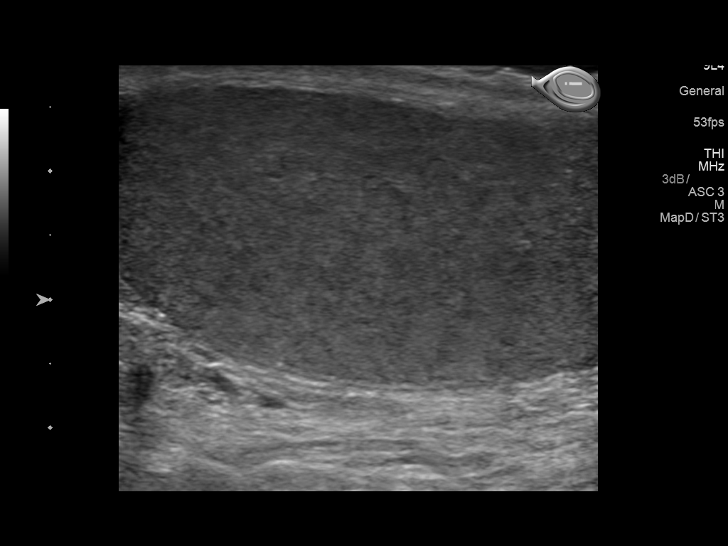
[im 22/66]
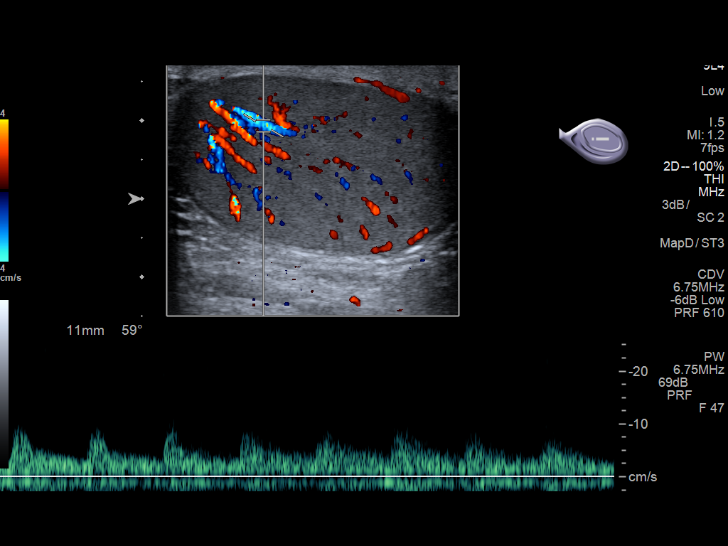
[im 25/66]
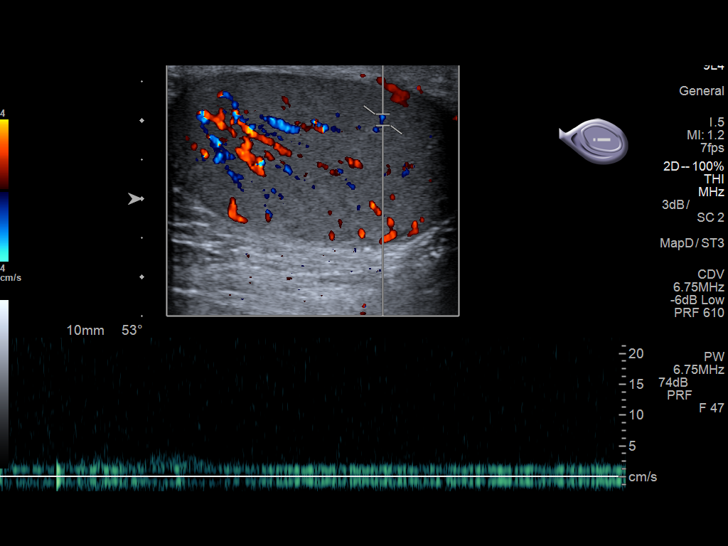
[im 30/66]
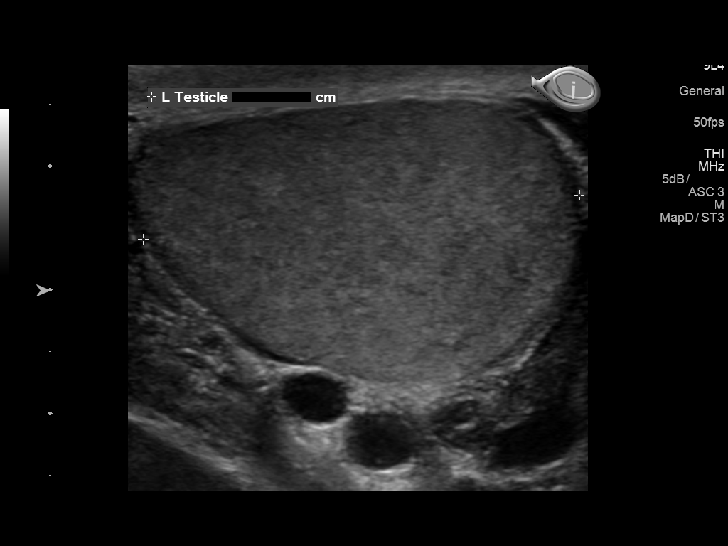
[im 36/66]
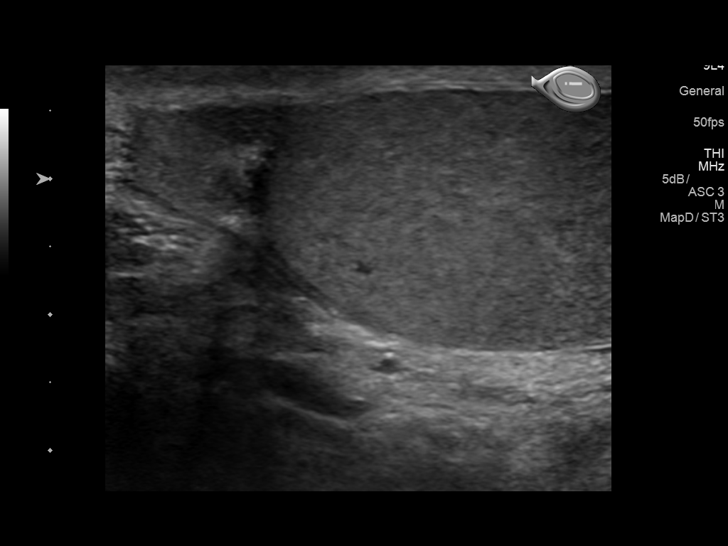
[im 41/66]
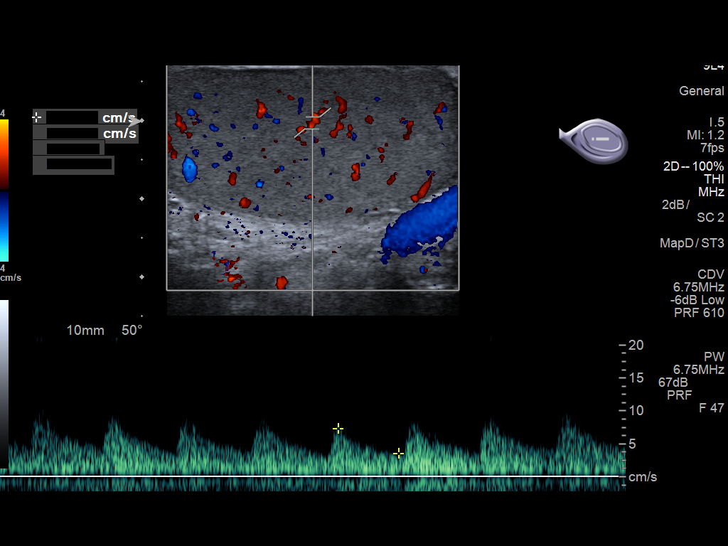
[im 44/66]
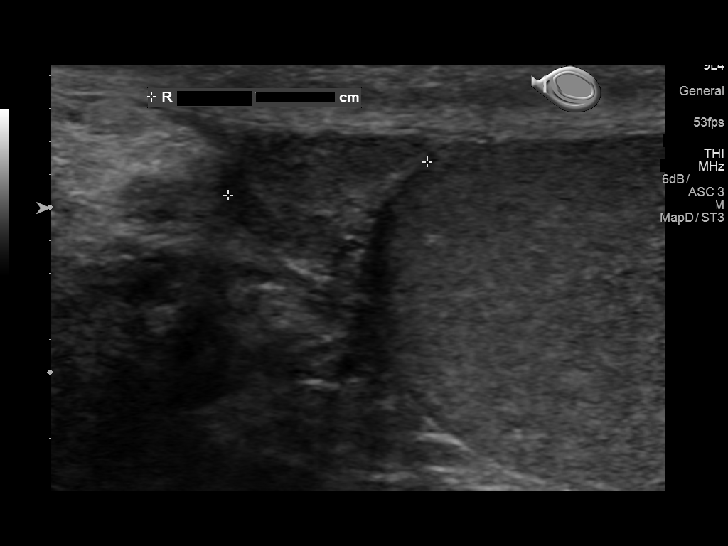
[im 49/66]
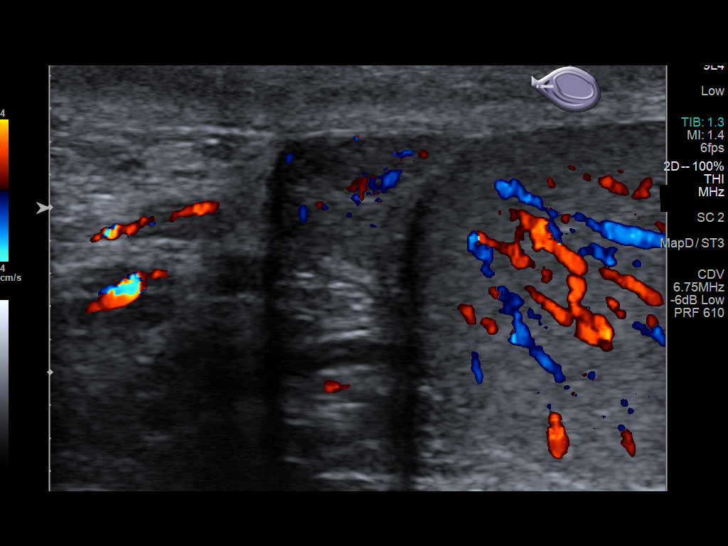
[im 55/66]
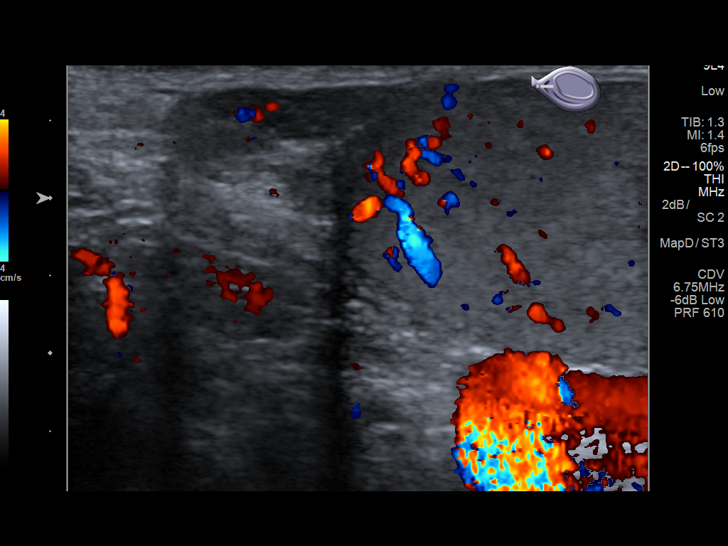
[im 60/66]
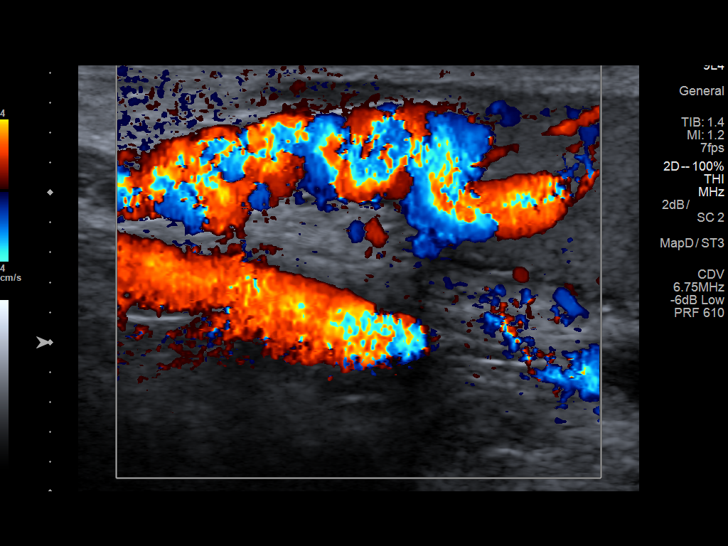
[im 66/66]
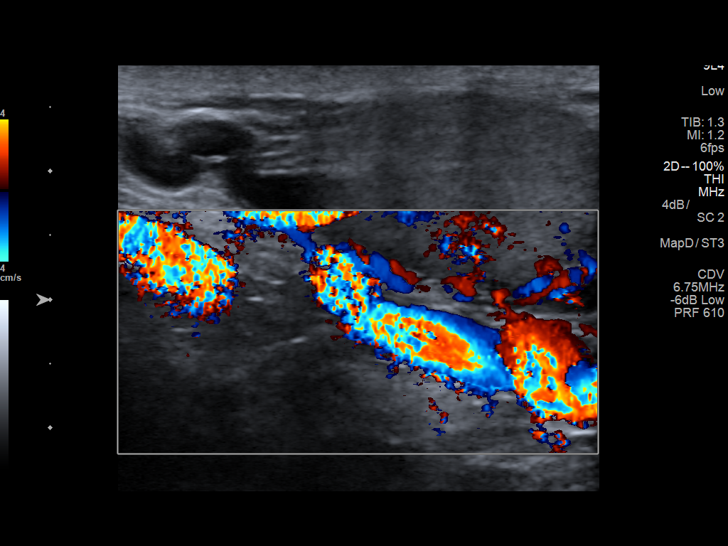

[14 of 25 positions shown; findings below may reference images not displayed]

FINDINGS: Right testicle

Measurements: 4.9 x 2.5 x 3.2 cm. No mass or microlithiasis
visualized.

Left testicle

Measurements: 4.4 x 2.0 x 3.6 cm. No mass or microlithiasis
visualized.

Right epididymis:  Normal in size and appearance.

Left epididymis:  Normal in size and appearance.

Hydrocele:  None visualized.

Varicocele:  Left-sided varicocele.

Pulsed Doppler interrogation of both testes demonstrates normal low
resistance arterial and venous waveforms bilaterally.
IMPRESSION: 1. Left-sided varicocele.
2. Otherwise unremarkable and normal scrotal ultrasound. No other
discrete mass identified.

## 2020-02-28 ENCOUNTER — Telehealth: Payer: Self-pay | Admitting: Family Medicine

## 2020-02-28 ENCOUNTER — Other Ambulatory Visit: Payer: Self-pay
